# Patient Record
Sex: Male | Born: 1980 | Race: Black or African American | Hispanic: No | Marital: Single | Smoking: Current some day smoker
Health system: Southern US, Community
[De-identification: ages and names within clinical notes are randomized; demographics above are authoritative.]

## PROBLEM LIST (undated history)

## (undated) DIAGNOSIS — M5430 Sciatica, unspecified side: Secondary | ICD-10-CM

---

## 2010-10-13 ENCOUNTER — Emergency Department (HOSPITAL_COMMUNITY): Payer: BC Managed Care – PPO

## 2010-10-13 ENCOUNTER — Emergency Department (HOSPITAL_COMMUNITY)
Admission: EM | Admit: 2010-10-13 | Discharge: 2010-10-14 | Disposition: A | Discharge status: Home / Self Care | Payer: BC Managed Care – PPO | Attending: Emergency Medicine | Admitting: Emergency Medicine

## 2010-10-13 DIAGNOSIS — R112 Nausea with vomiting, unspecified: Secondary | ICD-10-CM | POA: Insufficient documentation

## 2010-10-13 DIAGNOSIS — R197 Diarrhea, unspecified: Secondary | ICD-10-CM | POA: Insufficient documentation

## 2010-10-13 LAB — DIFFERENTIAL
Basophils Absolute: 0 10*3/uL (ref 0.0–0.1)
Eosinophils Absolute: 0 10*3/uL (ref 0.0–0.7)
Eosinophils Relative: 0 % (ref 0–5)
Lymphs Abs: 0.7 10*3/uL (ref 0.7–4.0)
Neutrophils Relative %: 83 % — ABNORMAL HIGH (ref 43–77)

## 2010-10-13 LAB — CBC
Platelets: 185 10*3/uL (ref 150–400)
RBC: 6.96 MIL/uL — ABNORMAL HIGH (ref 4.22–5.81)
RDW: 17.3 % — ABNORMAL HIGH (ref 11.5–15.5)
WBC: 8.4 10*3/uL (ref 4.0–10.5)

## 2010-10-13 IMAGING — CR DG ABDOMEN ACUTE W/ 1V CHEST
4 series · 4 of 4 positions shown · non-contrast
Comparison: None.

CLINICAL DATA: Nausea, vomiting, diarrhea

ACUTE ABDOMEN SERIES (ABDOMEN 2 VIEW & CHEST 1 VIEW)

[w chest pa]
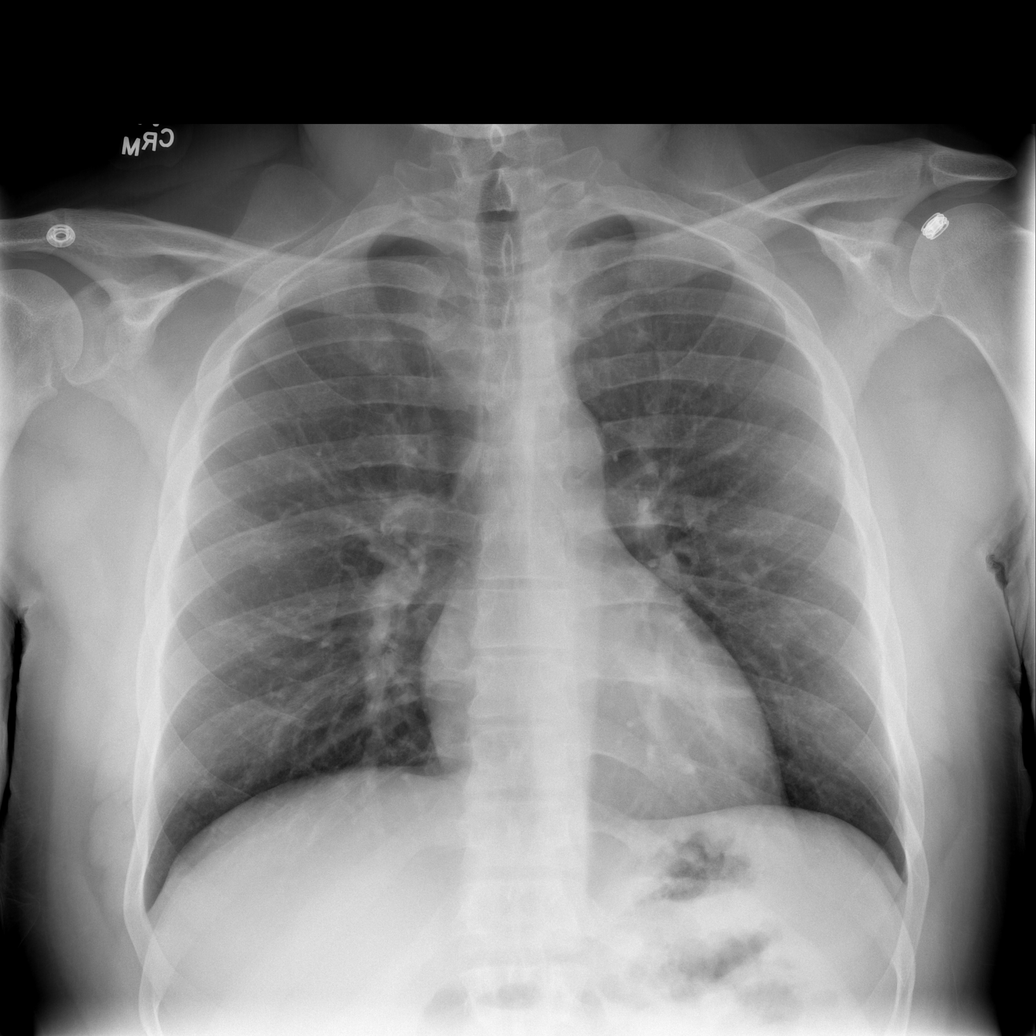

[w abdomen upright]
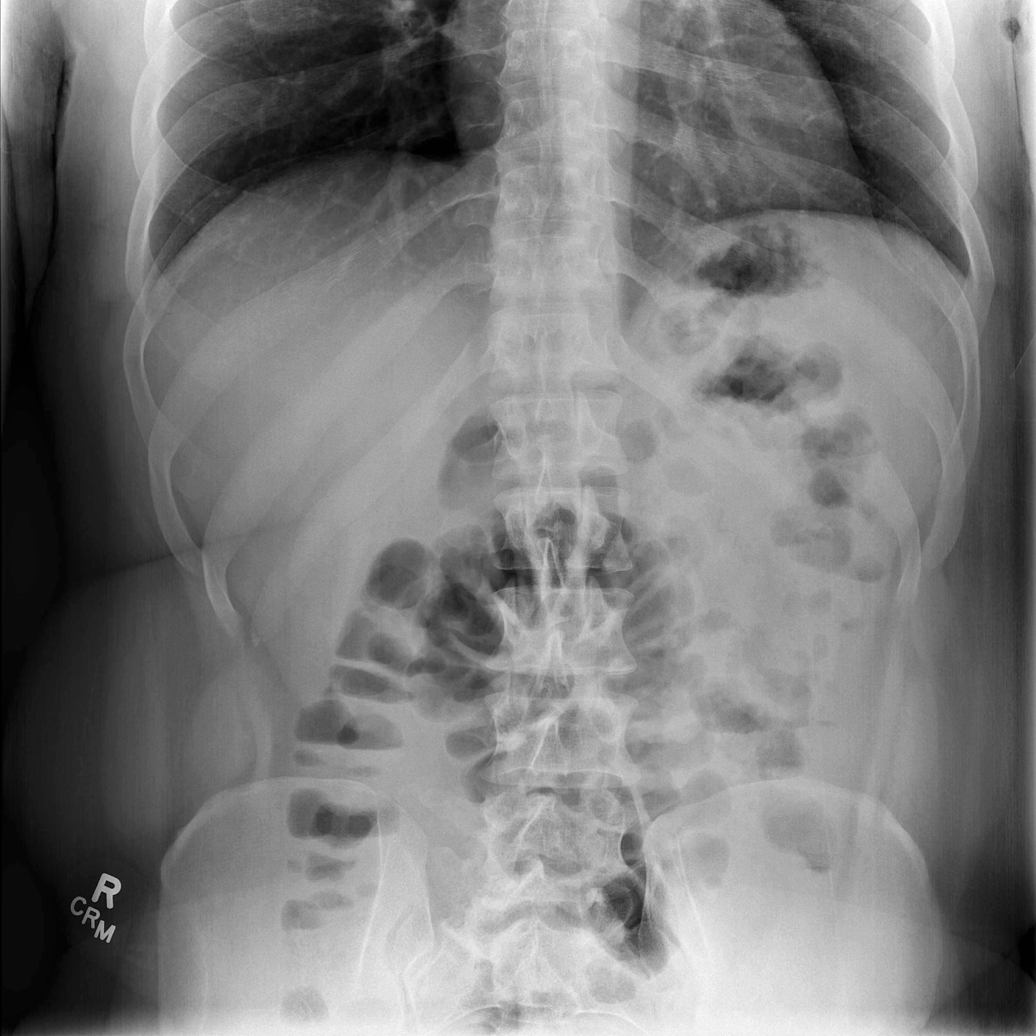

[t abdomen supine (1 of 2)]
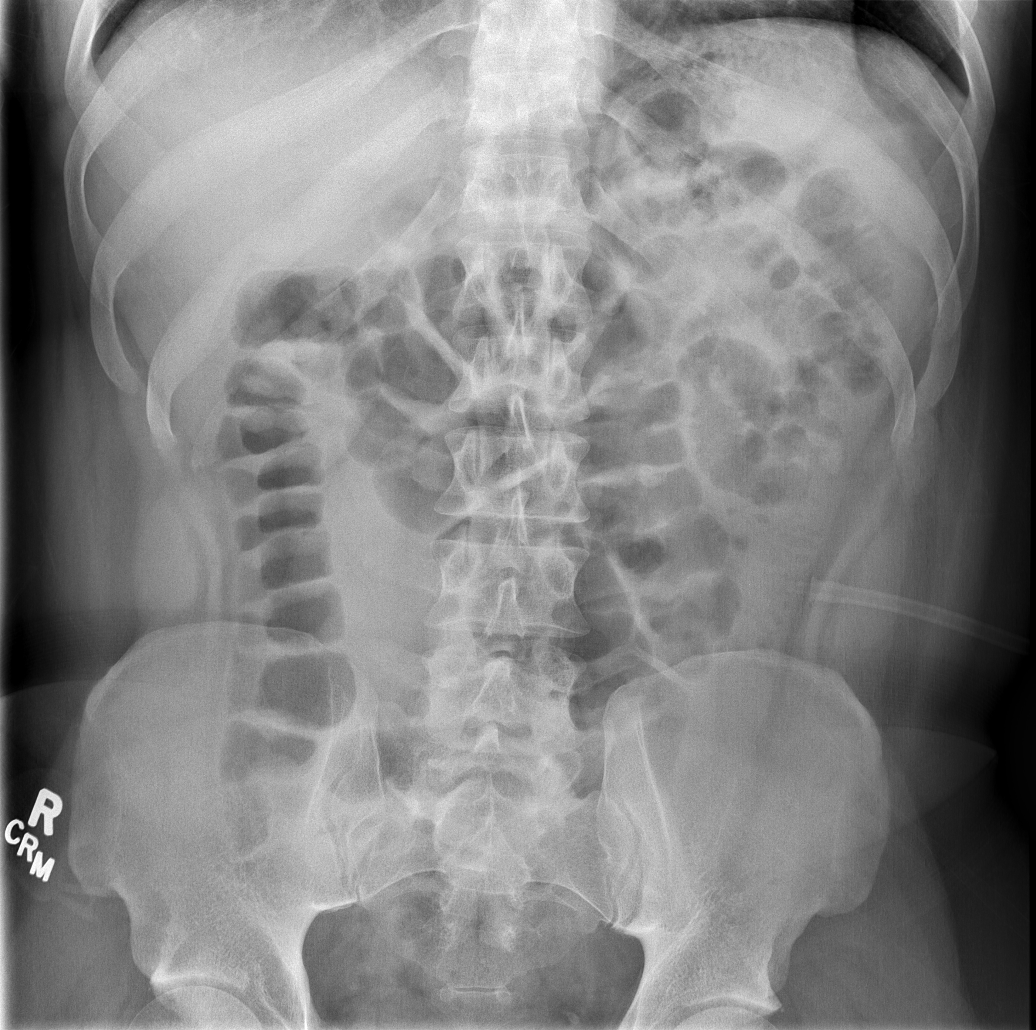

[t abdomen supine (2 of 2)]
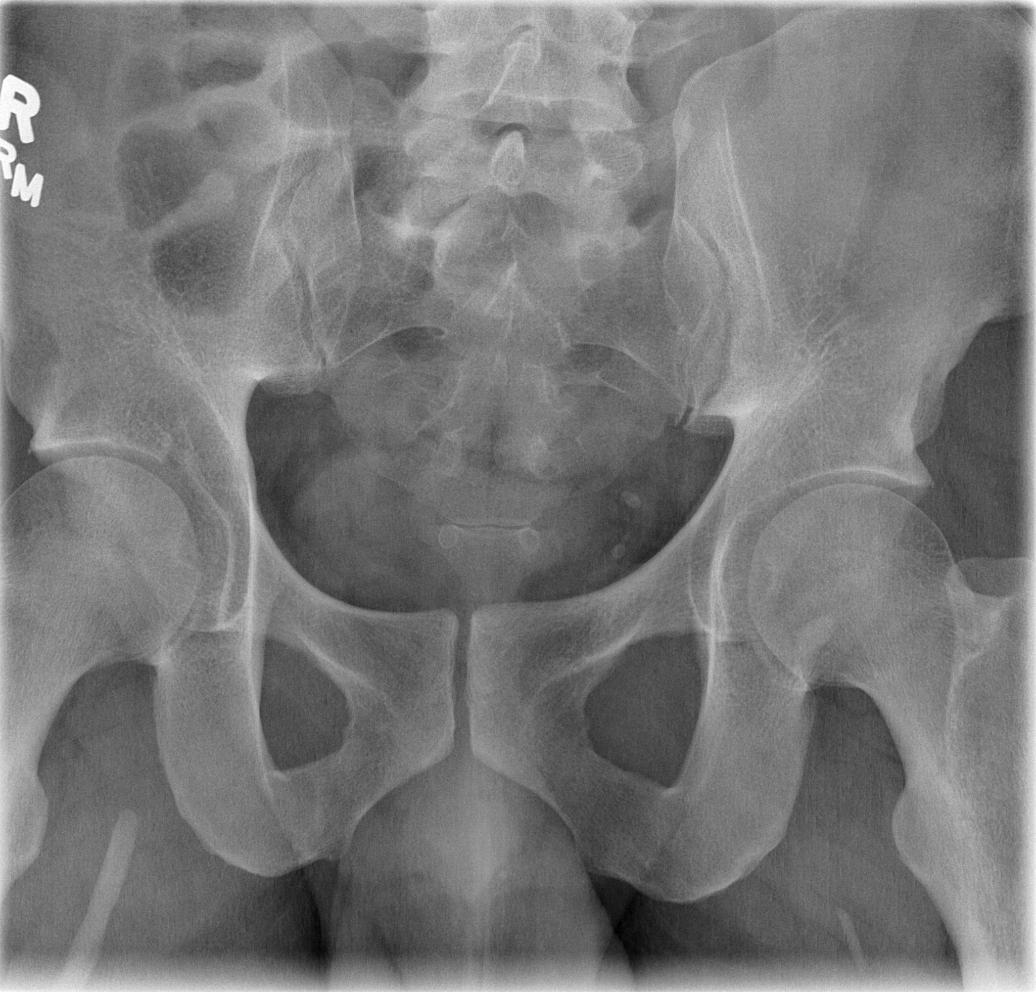

[4 of 4 positions shown; findings below may reference images not displayed]

FINDINGS: The frontal chest radiograph is clear.  No effusion.

No free air on the erect film.

A few gas filled nondilated small bowel loops scattered throughout
the mid abdomen.  Normal distribution of gas and stool throughout
the colon.  No abnormal abdominal calcifications.  Visualized bones
unremarkable.  Left pelvic phleboliths.
IMPRESSION: 1.  Normal bowel gas pattern.
2.  No free air.
3.  No acute cardiopulmonary disease.

## 2010-10-14 LAB — URINALYSIS, ROUTINE W REFLEX MICROSCOPIC
Hgb urine dipstick: NEGATIVE
Ketones, ur: 15 mg/dL — AB
Urine Glucose, Fasting: NEGATIVE mg/dL
pH: 5.5 (ref 5.0–8.0)

## 2010-10-14 LAB — COMPREHENSIVE METABOLIC PANEL
ALT: 39 U/L (ref 0–53)
AST: 40 U/L — ABNORMAL HIGH (ref 0–37)
Albumin: 4 g/dL (ref 3.5–5.2)
Alkaline Phosphatase: 58 U/L (ref 39–117)
BUN: 11 mg/dL (ref 6–23)
Chloride: 104 mEq/L (ref 96–112)
GFR calc Af Amer: >60 mL/min (ref >60)
Potassium: 3.8 mEq/L (ref 3.5–5.1)
Sodium: 140 mEq/L (ref 135–145)
Total Bilirubin: 0.5 mg/dL (ref 0.3–1.2)
Total Protein: 6.6 g/dL (ref 6.0–8.3)

## 2010-10-15 LAB — URINE CULTURE
Colony Count: NO GROWTH
Culture  Setup Time: 201202211228
Culture: NO GROWTH

## 2018-10-11 ENCOUNTER — Other Ambulatory Visit: Payer: Self-pay | Admitting: Internal Medicine

## 2018-10-11 ENCOUNTER — Ambulatory Visit
Admission: RE | Admit: 2018-10-11 | Discharge: 2018-10-11 | Disposition: A | Discharge status: Home / Self Care | Payer: BC Managed Care – PPO | Source: Ambulatory Visit | Attending: Internal Medicine | Admitting: Internal Medicine

## 2018-10-11 DIAGNOSIS — R109 Unspecified abdominal pain: Secondary | ICD-10-CM

## 2018-10-11 IMAGING — CR DG ABDOMEN 2V
3 series · 3 of 3 positions shown · non-contrast
Comparison: [DATE]

CLINICAL DATA: Abdominal pain with diarrhea, nausea and bloating
for 1 day.

EXAM:
ABDOMEN - 2 VIEW

[w abdomen upright *]
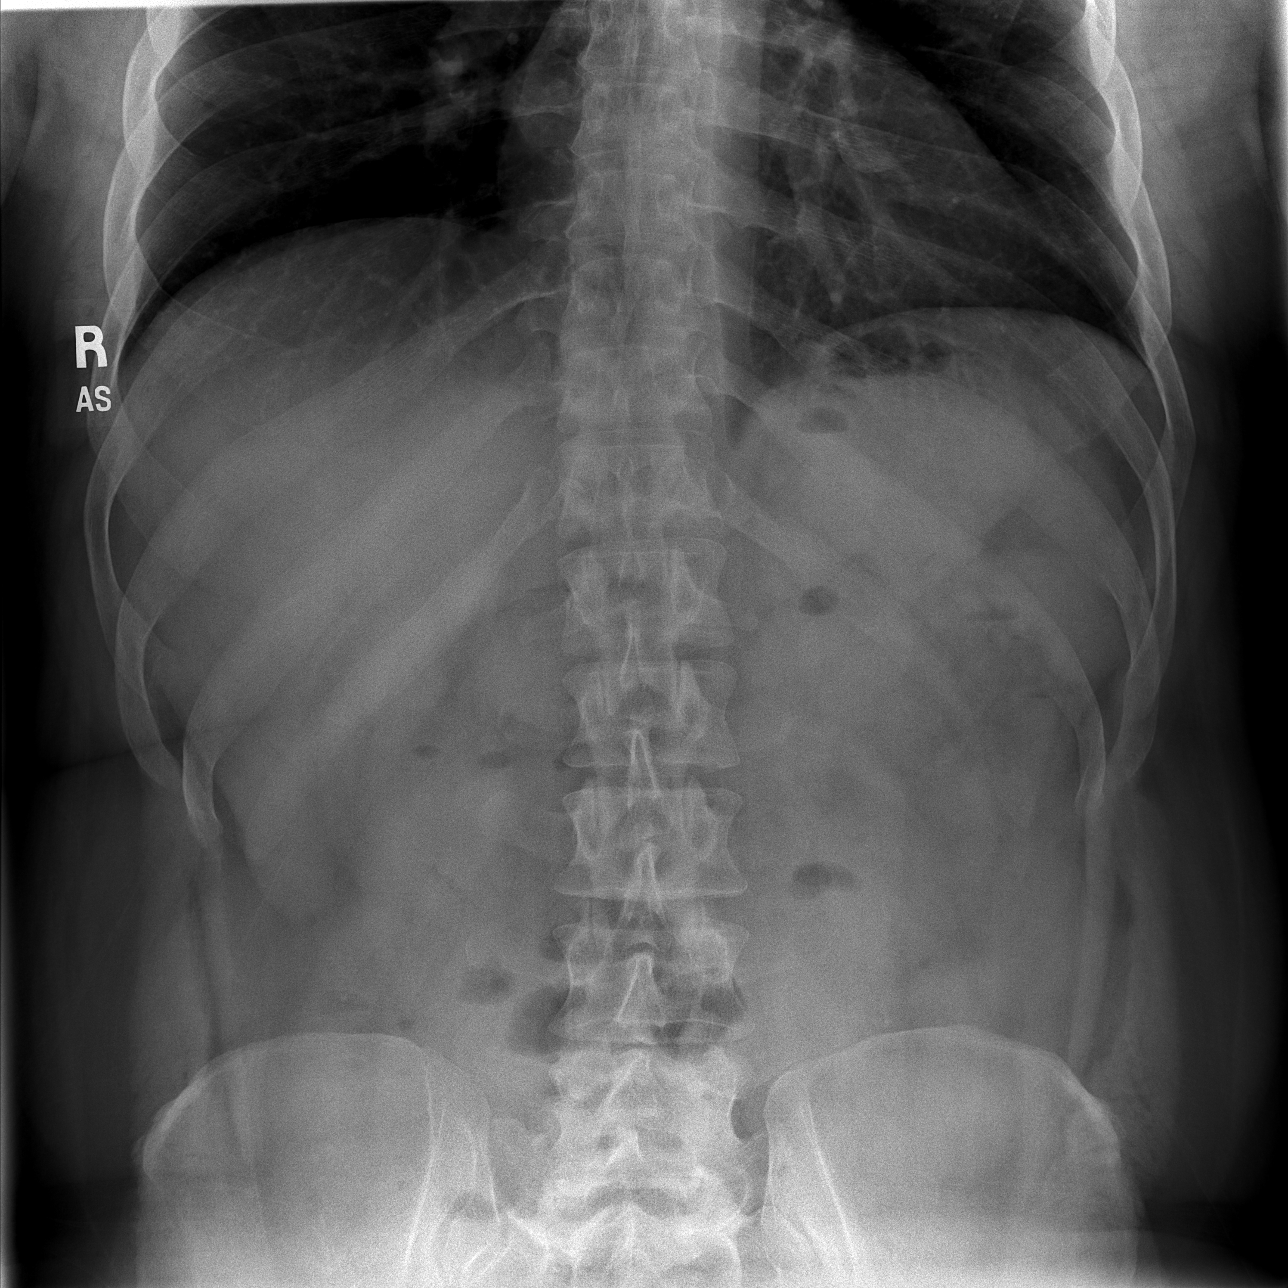

[t abdomen supine (1 of 2)]
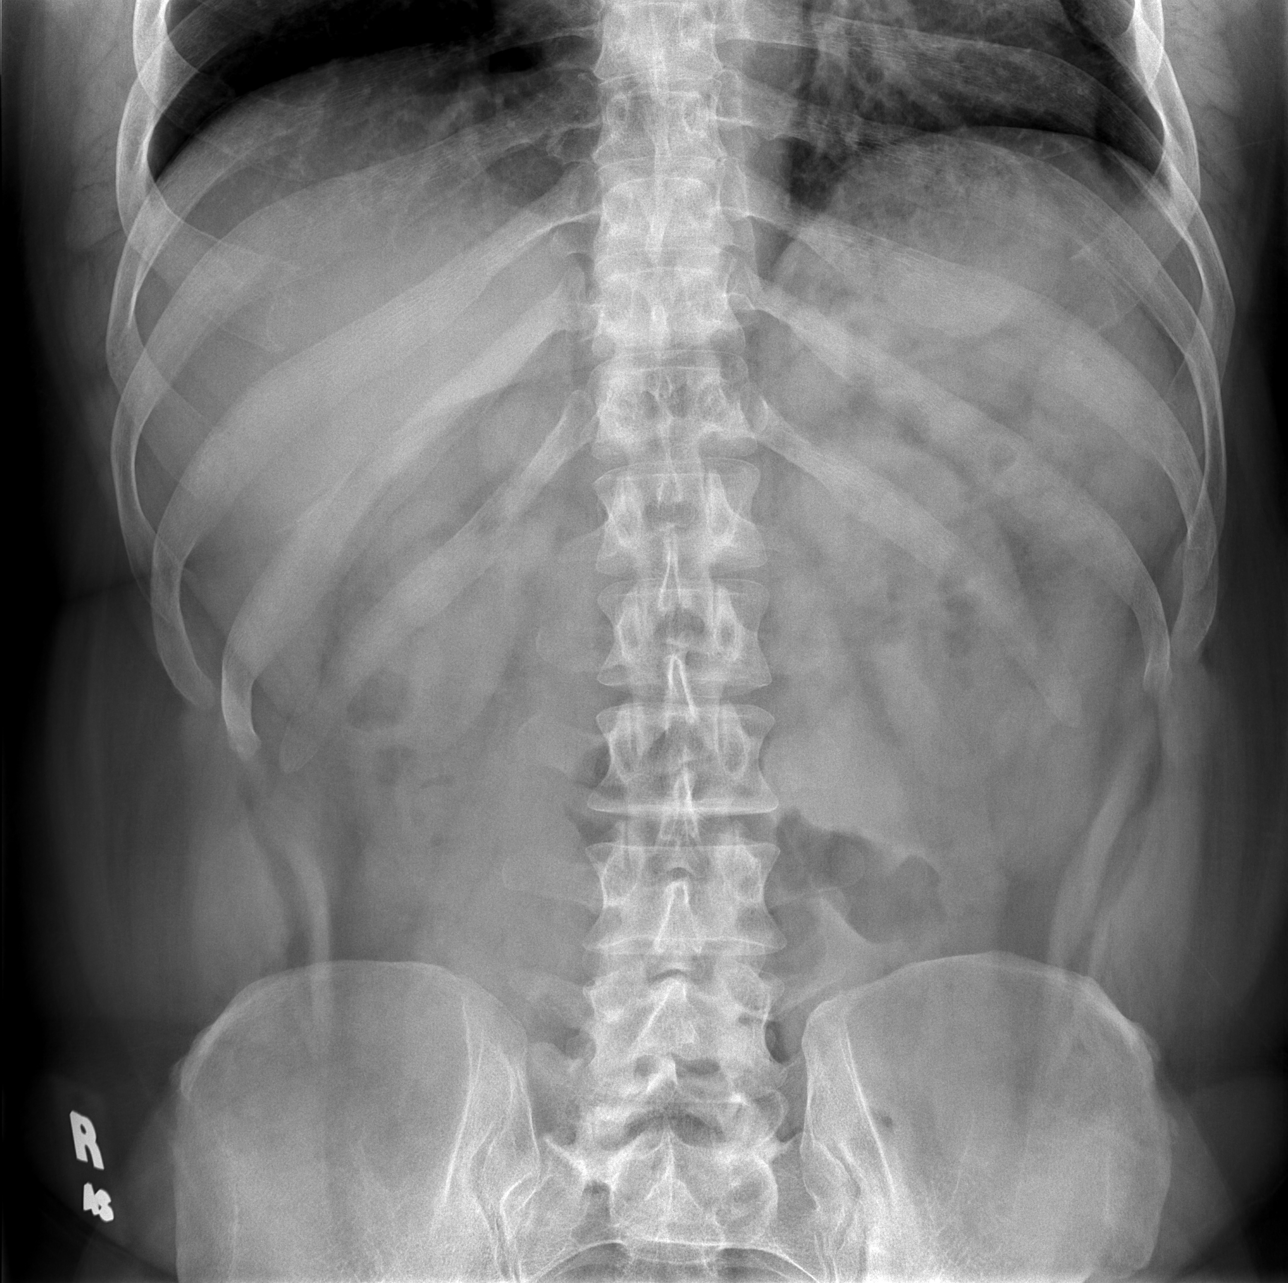

[t abdomen supine (2 of 2)]
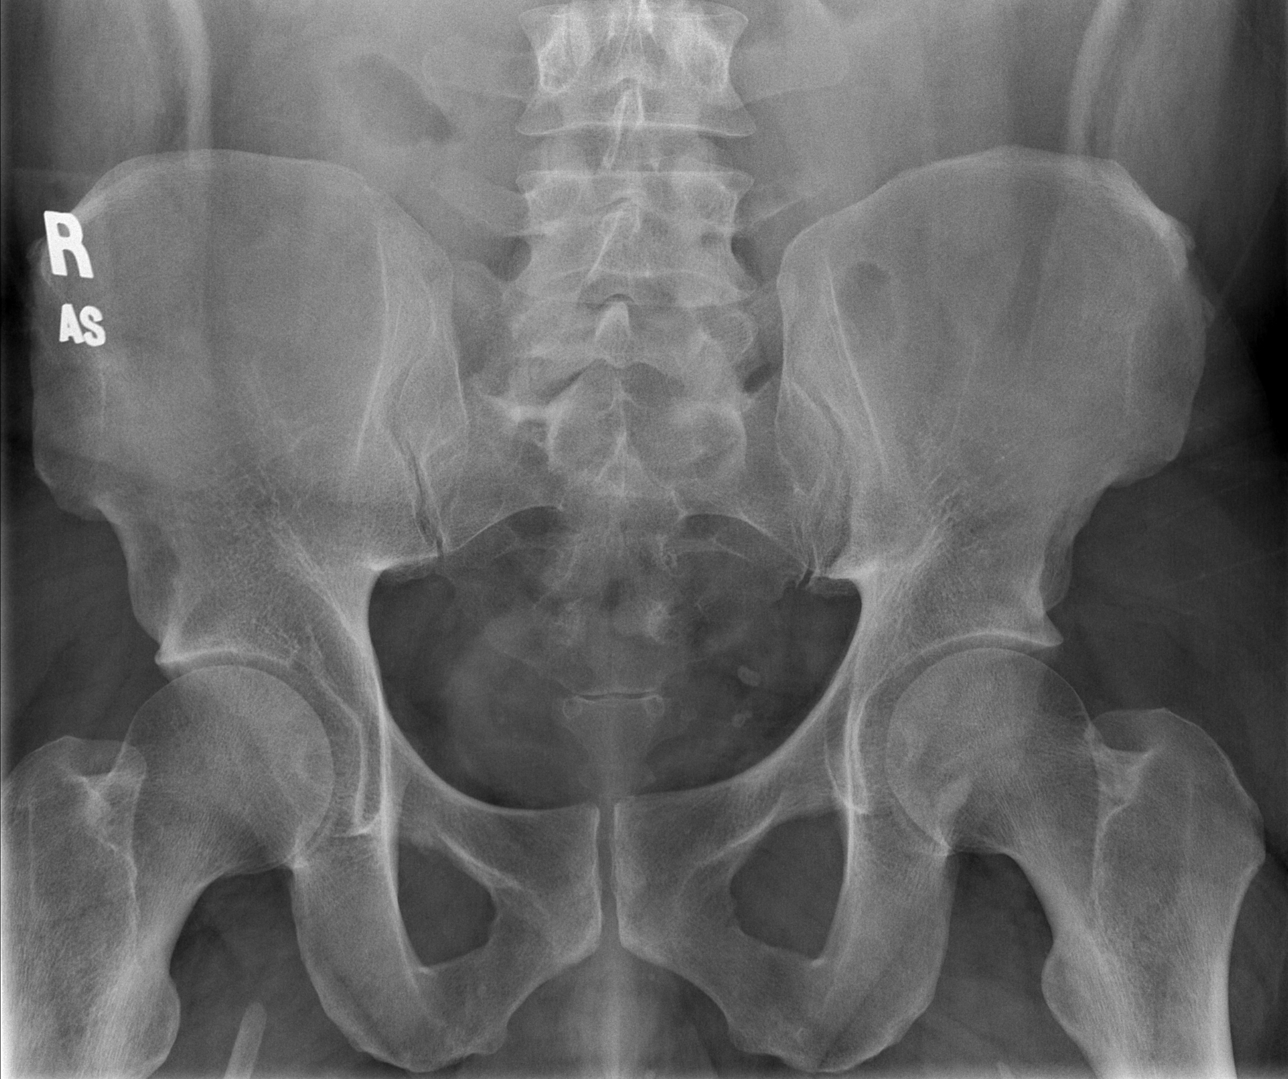

[3 of 3 positions shown; findings below may reference images not displayed]

FINDINGS: There is a relative paucity of bowel gas. No evidence of bowel
obstruction, significant generalized adynamic ileus or of free air.

Abdominal and pelvic soft tissues are within normal limits.

No skeletal abnormality.  Clear lung bases.
IMPRESSION: 1. Relative paucity of bowel gas consistent with the history of
diarrhea.
2. No other abnormality.  No bowel obstruction or free air.

## 2019-06-15 ENCOUNTER — Other Ambulatory Visit: Payer: Self-pay

## 2019-06-15 DIAGNOSIS — Z20822 Contact with and (suspected) exposure to covid-19: Secondary | ICD-10-CM

## 2019-06-17 LAB — NOVEL CORONAVIRUS, NAA: SARS-CoV-2, NAA: NOT DETECTED

## 2019-11-09 ENCOUNTER — Ambulatory Visit: Payer: BC Managed Care – PPO | Attending: Family

## 2019-11-09 DIAGNOSIS — Z23 Encounter for immunization: Secondary | ICD-10-CM

## 2019-11-09 NOTE — Progress Notes (Signed)
   Covid-19 Vaccination Clinic  Name:  Dennis Joseph    MRN: [id number] DOB: 1980-12-11  11/09/2019  Mr. [name] was observed post Covid-19 immunization for 15 minutes without incident. He was provided with Vaccine Information Sheet and instruction to access the V-Safe system.   Mr. [name] was instructed to call 911 with any severe reactions post vaccine: Marland Kitchen Difficulty breathing  . Swelling of face and throat  . A fast heartbeat  . A bad rash all over body  . Dizziness and weakness   Immunizations Administered    Name Date Dose VIS Date Route   Moderna COVID-19 Vaccine 11/09/2019  2:29 PM 0.5 mL 07/25/2019 Intramuscular   Manufacturer: Moderna   Lot: [id number]   NDC: 68616-837-29

## 2019-12-12 ENCOUNTER — Ambulatory Visit: Payer: BC Managed Care – PPO | Attending: Family

## 2019-12-12 DIAGNOSIS — Z23 Encounter for immunization: Secondary | ICD-10-CM

## 2019-12-12 NOTE — Progress Notes (Signed)
   Covid-19 Vaccination Clinic  Name:  Dennis Joseph    MRN: [id number] DOB: 13-Jul-1981  12/12/2019  [name] was observed post Covid-19 immunization for 15 minutes without incident. He was provided with Vaccine Information Sheet and instruction to access the V-Safe system.   [name] was instructed to call 911 with any severe reactions post vaccine: Marland Kitchen Difficulty breathing  . Swelling of face and throat  . A fast heartbeat  . A bad rash all over body  . Dizziness and weakness   Immunizations Administered    Name Date Dose VIS Date Route   Moderna COVID-19 Vaccine 12/12/2019  1:23 PM 0.5 mL 07/2019 Intramuscular   Manufacturer: Moderna   Lot: [id number]   NDC: 25638-937-34

## 2020-01-12 ENCOUNTER — Encounter (HOSPITAL_BASED_OUTPATIENT_CLINIC_OR_DEPARTMENT_OTHER): Payer: Self-pay | Admitting: *Deleted

## 2020-01-12 ENCOUNTER — Emergency Department (HOSPITAL_BASED_OUTPATIENT_CLINIC_OR_DEPARTMENT_OTHER): Payer: BC Managed Care – PPO

## 2020-01-12 ENCOUNTER — Other Ambulatory Visit: Payer: Self-pay

## 2020-01-12 DIAGNOSIS — X58XXXA Exposure to other specified factors, initial encounter: Secondary | ICD-10-CM | POA: Diagnosis not present

## 2020-01-12 DIAGNOSIS — S39012A Strain of muscle, fascia and tendon of lower back, initial encounter: Secondary | ICD-10-CM | POA: Insufficient documentation

## 2020-01-12 DIAGNOSIS — Y9289 Other specified places as the place of occurrence of the external cause: Secondary | ICD-10-CM | POA: Diagnosis not present

## 2020-01-12 DIAGNOSIS — Y9389 Activity, other specified: Secondary | ICD-10-CM | POA: Insufficient documentation

## 2020-01-12 DIAGNOSIS — M5489 Other dorsalgia: Secondary | ICD-10-CM | POA: Diagnosis present

## 2020-01-12 DIAGNOSIS — Y999 Unspecified external cause status: Secondary | ICD-10-CM | POA: Diagnosis not present

## 2020-01-12 IMAGING — DX DG LUMBAR SPINE COMPLETE 4+V
5 series · 5 of 5 positions shown · non-contrast
Comparison: None.

CLINICAL DATA: Back pain

EXAM:
LUMBAR SPINE - COMPLETE 4+ VIEW

[l-spine ap]
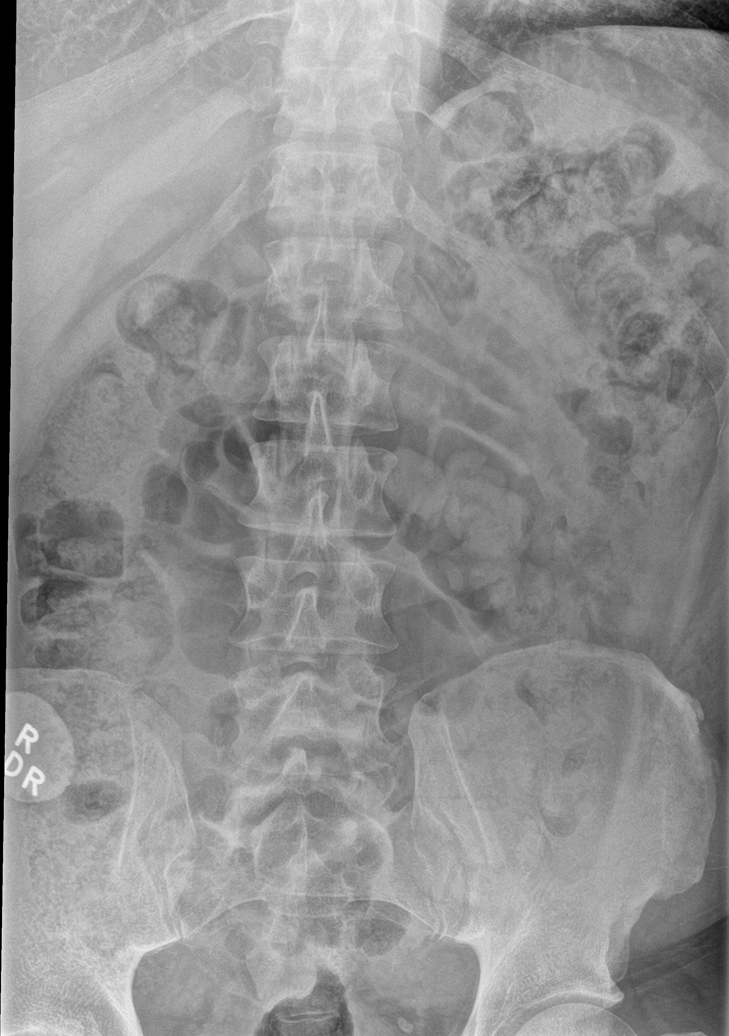

[l-spine obl (1 of 2)]
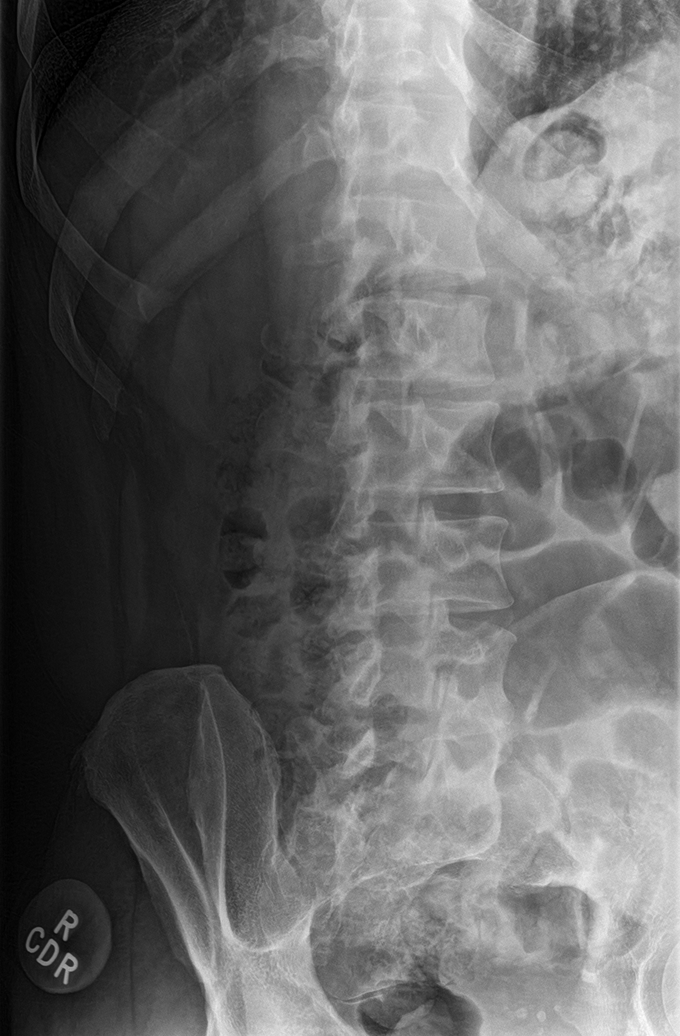

[l-spine obl (2 of 2)]
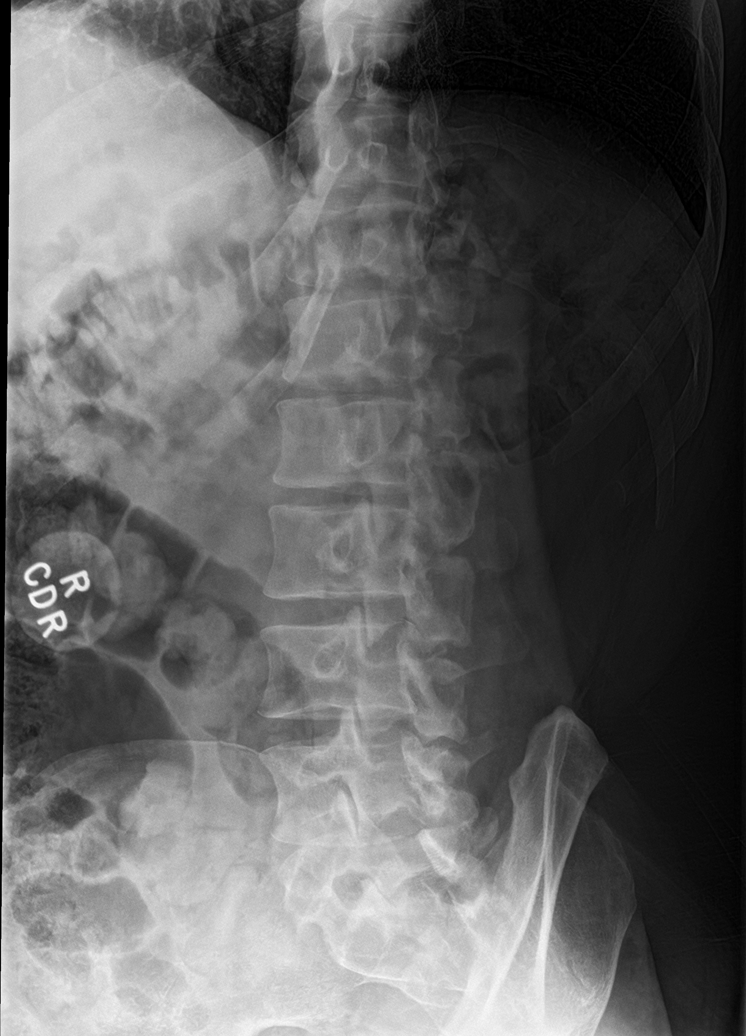

[l-spine lat]
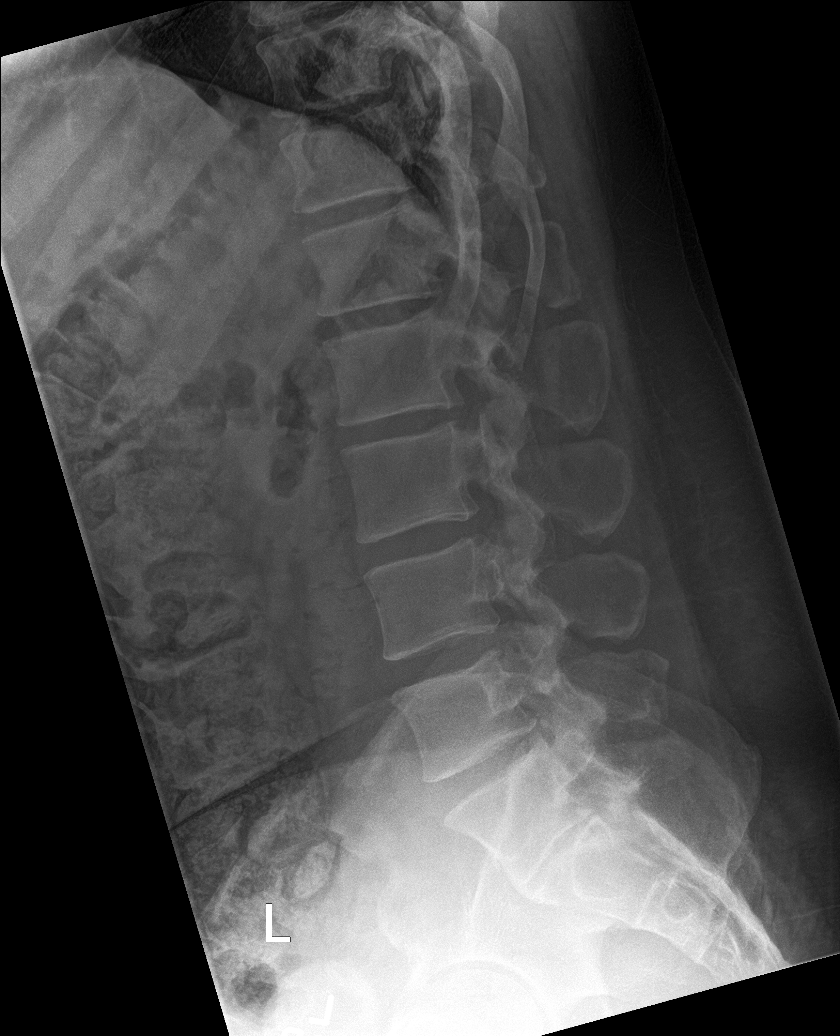

[l-spine spot]
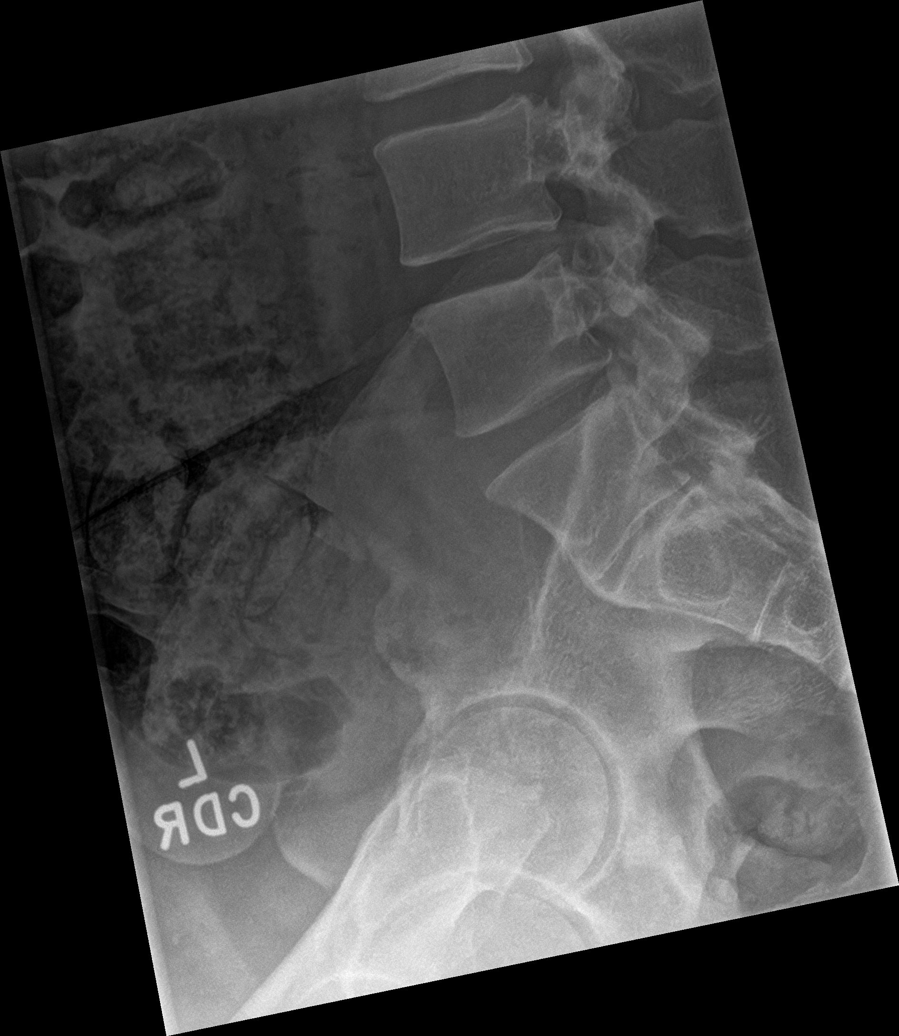

[5 of 5 positions shown; findings below may reference images not displayed]

FINDINGS: There is no evidence of lumbar spine fracture. Alignment is normal.
Intervertebral disc spaces are maintained. There is a large amount
of stool in the colon.
IMPRESSION: Negative.

## 2020-01-12 NOTE — ED Triage Notes (Signed)
C/o  Lower right back pain which radiates into right hip and thigh  X 9 days

## 2020-01-13 ENCOUNTER — Emergency Department (HOSPITAL_BASED_OUTPATIENT_CLINIC_OR_DEPARTMENT_OTHER)
Admission: EM | Admit: 2020-01-13 | Discharge: 2020-01-13 | Disposition: A | Discharge status: Home / Self Care | Payer: BC Managed Care – PPO | Attending: Emergency Medicine | Admitting: Emergency Medicine

## 2020-01-13 DIAGNOSIS — S39012A Strain of muscle, fascia and tendon of lower back, initial encounter: Secondary | ICD-10-CM

## 2020-01-13 HISTORY — DX: Sciatica, unspecified side: M54.30

## 2020-01-13 NOTE — ED Notes (Signed)
ED Provider at bedside. 

## 2020-01-13 NOTE — Discharge Instructions (Signed)
You may use over-the-counter Motrin (Ibuprofen), Acetaminophen (Tylenol), topical muscle creams such as SalonPas, Icy Hot, Bengay, etc. Please stretch, apply heat, and have massage therapy for additional assistance. ° °

## 2020-01-13 NOTE — ED Provider Notes (Signed)
[facility] EMERGENCY DEPARTMENT Provider Note  CSN: [id number] Arrival date & time: 01/12/20 2318  Chief Complaint(s) Back Pain  HPI Dennis Joseph is a 39 y.o. male   HPI  CC: Lower back pain  Onset/Duration: 1 to 2 weeks Timing: Constant but fluctuating. Location: Right lower back Quality: Aching/cramping Severity: Mild to severe. Currently mild Modifying Factors:  Improved by: Ice, heat, massages, ibuprofen  Worsened by: Movement Associated Signs/Symptoms:  Pertinent (+): Pain radiating down to the mid thigh  Pertinent (-): Fevers, chills, abdominal pain, bladder/bowel incontinence, lower extremity weakness or loss of sensation Context: No trauma.  Believes he overdid it with rest.  Past Medical History Past Medical History:  Diagnosis Date  . Sciatica    There are no problems to display for this patient.  Home Medication(s) Prior to Admission medications   Not on File                                                                                                                                    Past Surgical History History reviewed. No pertinent surgical history. Family History No family history on file.  Social History Social History   Tobacco Use  . Smoking status: Never Smoker  . Smokeless tobacco: Never Used  Substance Use Topics  . Alcohol use: Not Currently  . Drug use: Not Currently   Allergies Patient has no known allergies.  Review of Systems Review of Systems All other systems are reviewed and are negative for acute change except as noted in the HPI  Physical Exam Vital Signs  I have reviewed the triage vital signs BP 124/68 (BP Location: Right Arm)   Pulse (!) 50   Temp 98.6 F (37 C)   Resp 20   SpO2 97%   Physical Exam Vitals reviewed.  Constitutional:      General: He is not in acute distress.    Appearance: He is well-developed. He is not diaphoretic.  HENT:     Head: Normocephalic and atraumatic.     Jaw:  No trismus.     Right Ear: External ear normal.     Left Ear: External ear normal.     Nose: Nose normal.  Eyes:     General: No scleral icterus.    Conjunctiva/sclera: Conjunctivae normal.  Neck:     Trachea: Phonation normal.  Cardiovascular:     Rate and Rhythm: Normal rate and regular rhythm.  Pulmonary:     Effort: Pulmonary effort is normal. No respiratory distress.     Breath sounds: No stridor.  Abdominal:     General: There is no distension.  Musculoskeletal:        General: Normal range of motion.     Cervical back: Normal range of motion.  Neurological:     Mental Status: He is alert and oriented to person, place, and time.     Comments: Spine Exam: Strength: 5/5  throughout LE bilaterally (hip flexion/extension, adduction/abduction; knee flexion/extension; foot dorsiflexion/plantarflexion, inversion/eversion; great toe inversion) Sensation: Intact to light touch in proximal and distal LE bilaterally Reflexes: 1+ quadriceps and achilles reflexes   Psychiatric:        Behavior: Behavior normal.     ED Results and Treatments Labs (all labs ordered are listed, but only abnormal results are displayed) Labs Reviewed - No data to display                                                                                                                       EKG  EKG Interpretation  Date/Time:    Ventricular Rate:    PR Interval:    QRS Duration:   QT Interval:    QTC Calculation:   R Axis:     Text Interpretation:        Radiology DG Lumbar Spine Complete  Result Date: 01/13/2020 CLINICAL DATA:  Back pain EXAM: LUMBAR SPINE - COMPLETE 4+ VIEW COMPARISON:  None. FINDINGS: There is no evidence of lumbar spine fracture. Alignment is normal. Intervertebral disc spaces are maintained. There is a large amount of stool in the colon. IMPRESSION: Negative. Electronically Signed   By: Katherine Mantle M.D.   On: 01/13/2020 00:54    Pertinent labs & imaging results that  were available during my care of the patient were reviewed by me and considered in my medical decision making (see chart for details).  Medications Ordered in ED Medications - No data to display                                                                                                                                  Procedures Procedures  (including critical care time)  Medical Decision Making / ED Course I have reviewed the nursing notes for this encounter and the patient's prior records (if available in EHR or on provided paperwork).   Dennis Joseph was evaluated in Emergency Department on 01/13/2020 for the symptoms described in the history of present illness. He was evaluated in the context of the global COVID-19 pandemic, which necessitated consideration that the patient might be at risk for infection with the SARS-CoV-2 virus that causes COVID-19. Institutional protocols and algorithms that pertain to the evaluation of patients at risk for COVID-19 are in a state of rapid change based on information released by regulatory bodies including the CDC and federal and state organizations.  These policies and algorithms were followed during the patient's care in the ED.  39 y.o. male presents with back pain in lumbar area for 2 weeks with signs of radicular pain. No acute traumatic onset. No red flag symptoms of fever, weight loss, saddle anesthesia, weakness, fecal/urinary incontinence or urinary retention.   Plain film in triage negative.  Suspect MSK etiology. No indication for imaging emergently. Patient was recommended to take short course of scheduled NSAIDs and engage in early mobility as definitive treatment. Return precautions discussed for worsening or new concerning symptoms.        Final Clinical Impression(s) / ED Diagnoses Final diagnoses:  Strain of lumbar region, initial encounter    The patient appears reasonably screened and/or stabilized for discharge and I doubt  any other medical condition or other [facility] requiring further screening, evaluation, or treatment in the ED at this time prior to discharge. Safe for discharge with strict return precautions.  Disposition: Discharge  Condition: Good  I have discussed the results, Dx and Tx plan with the patient/family who expressed understanding and agree(s) with the plan. Discharge instructions discussed at length. The patient/family was given strict return precautions who verbalized understanding of the instructions. No further questions at time of discharge.    ED Discharge Orders    None        Follow Up: Seward Carol, MD [address] [place] [address] [phone]  Schedule an appointment as soon as possible for a visit       This chart was dictated using voice recognition software.  Despite best efforts to proofread,  errors can occur which can change the documentation meaning.   Fatima Blank, MD 01/13/20 [phone]

## 2020-01-15 ENCOUNTER — Other Ambulatory Visit: Payer: Self-pay | Admitting: Geriatric Medicine

## 2020-01-15 DIAGNOSIS — M545 Low back pain, unspecified: Secondary | ICD-10-CM

## 2020-01-17 ENCOUNTER — Ambulatory Visit
Admission: RE | Admit: 2020-01-17 | Discharge: 2020-01-17 | Disposition: A | Discharge status: Home / Self Care | Payer: BC Managed Care – PPO | Source: Ambulatory Visit | Attending: Geriatric Medicine | Admitting: Geriatric Medicine

## 2020-01-17 DIAGNOSIS — M545 Low back pain, unspecified: Secondary | ICD-10-CM

## 2020-01-17 IMAGING — MR MR LUMBAR SPINE W/O CM
4 of 6 series · 18 of 48 positions shown · non-contrast
Comparison: Comparison made with prior radiograph from [DATE].

CLINICAL DATA: Initial evaluation for back pain in spasms,
difficulty with ambulation for 1 month.

EXAM:
MRI LUMBAR SPINE WITHOUT CONTRAST
TECHNIQUE: Multiplanar, multisequence MR imaging of the lumbar spine was
performed. No intravenous contrast was administered.

[Series 5: T2 · sagittal · 4.0mm · 0.73mm/px · 4 of 15 slices shown (1 of 2)]
[im 1/15]
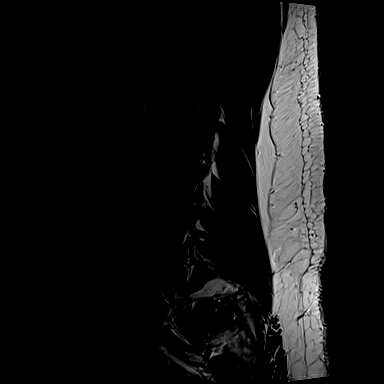
[im 5/15]
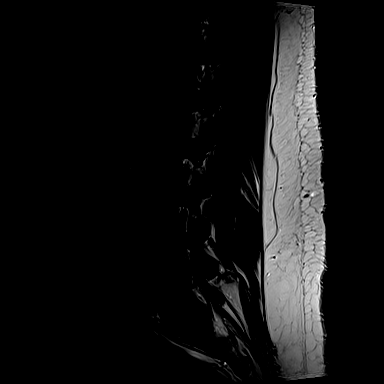
[im 10/15]
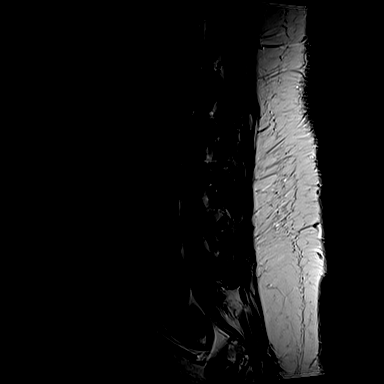
[im 15/15]
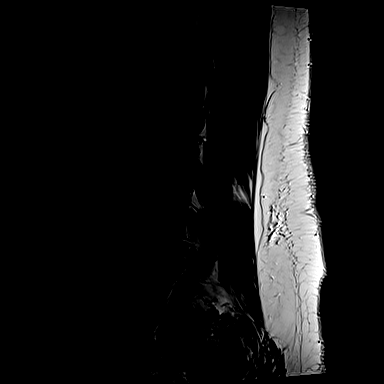

[Series 6: T1 · sagittal · 4.0mm · 0.73mm/px · 3 of 15 slices shown (1 of 2)]
[im 1/15]
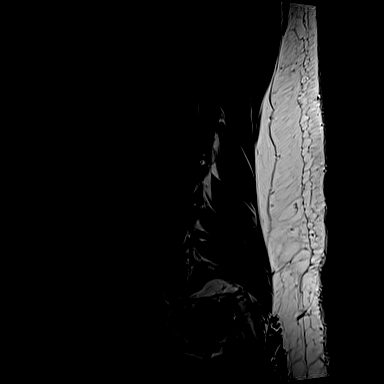
[im 10/15]
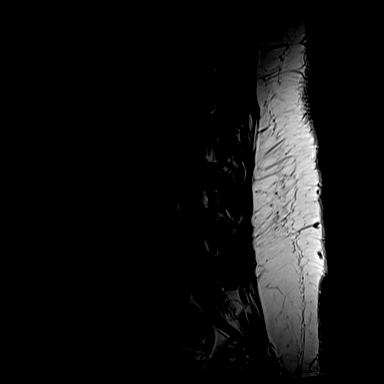
[im 15/15]
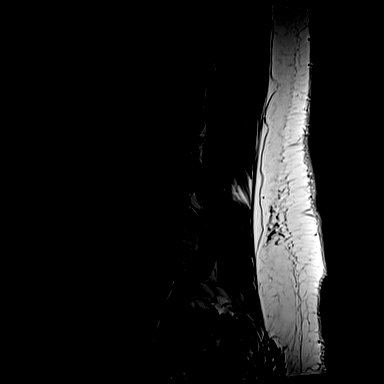

[Series 10: T1 · axial · 4.0mm · 0.28mm/px · z∈[-75,+78]mm · 3 of 39 slices shown (2 of 2)]
[im 7/39]
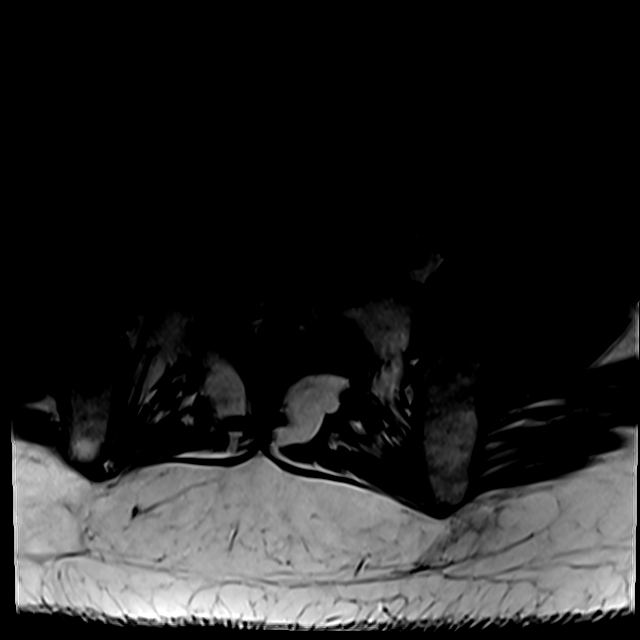
[im 21/39]
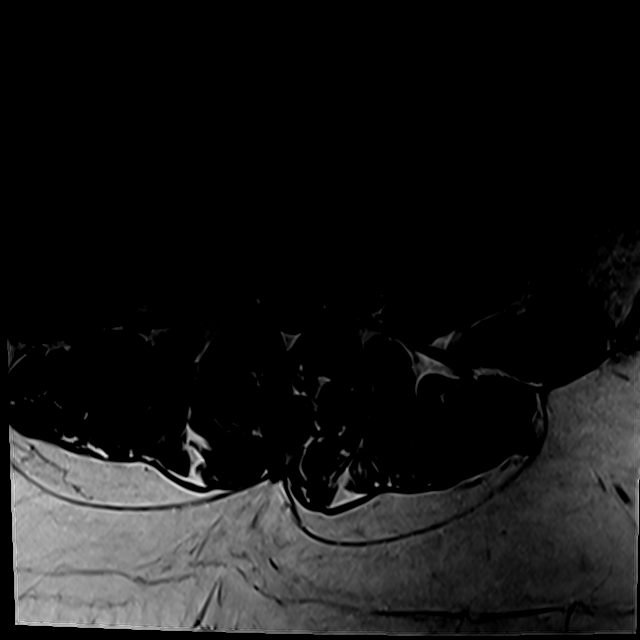
[im 35/39]
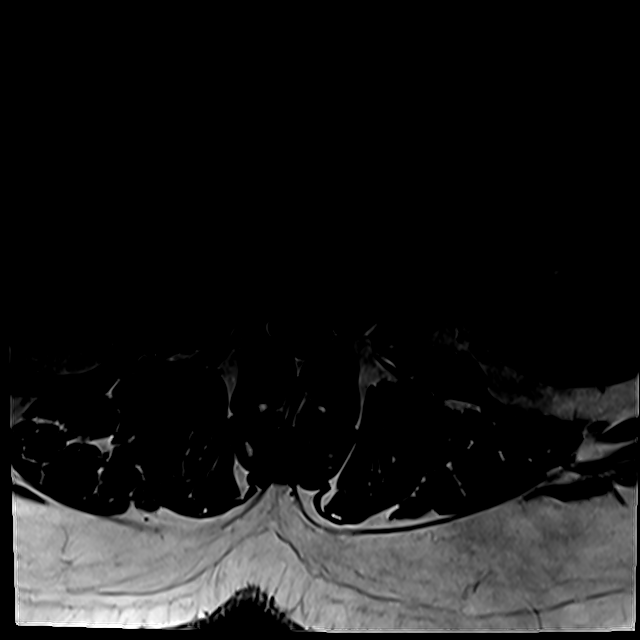

[Series 17: T2 · axial · 4.0mm · 0.28mm/px · z∈[-96,+77]mm · 8 of 39 slices shown (2 of 2)]
[im 1/39]
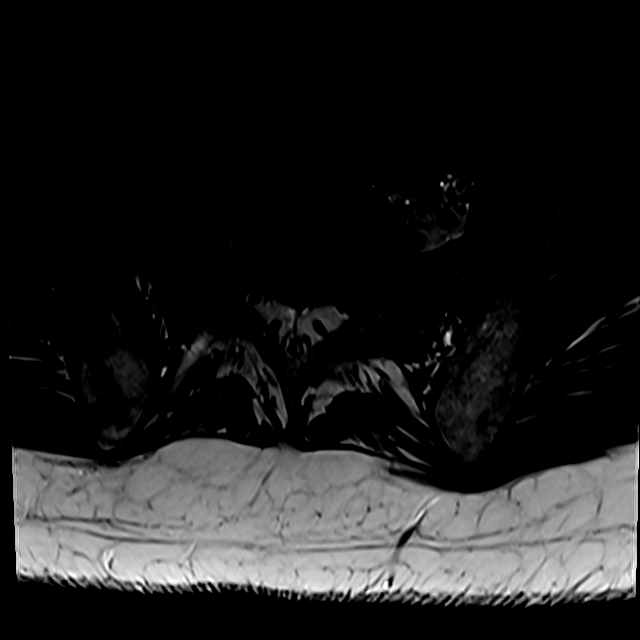
[im 7/39]
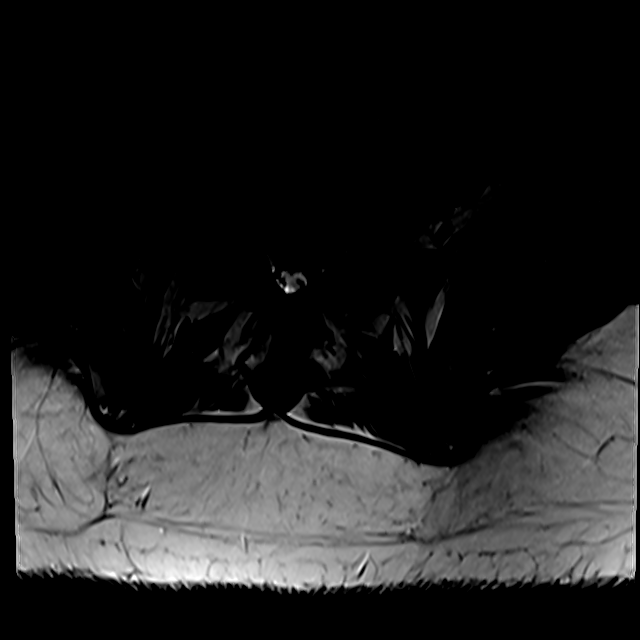
[im 11/39]
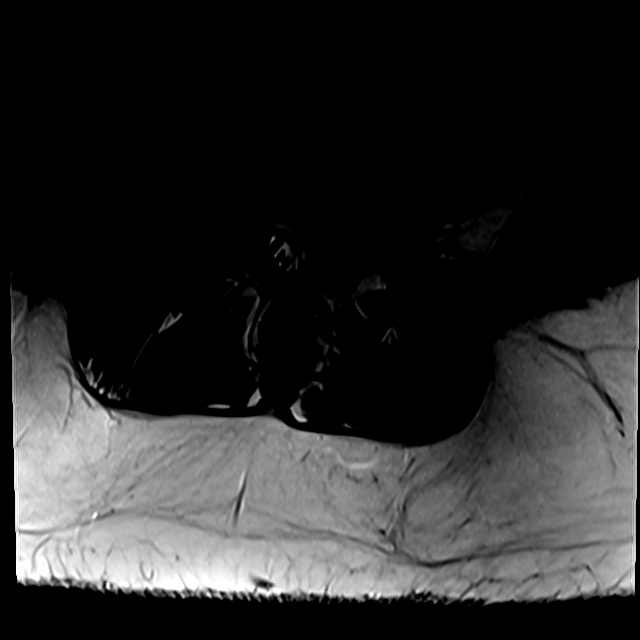
[im 18/39]
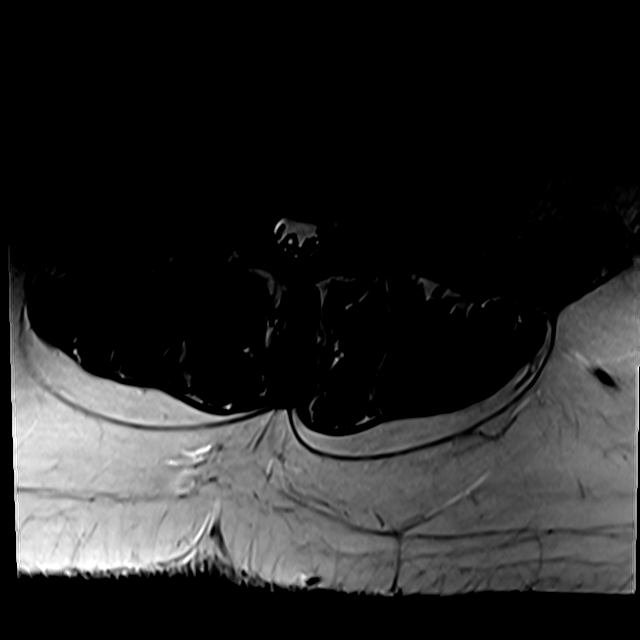
[im 21/39]
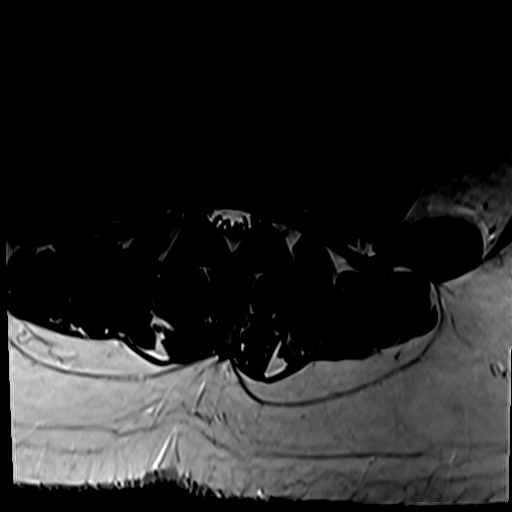
[im 28/39]
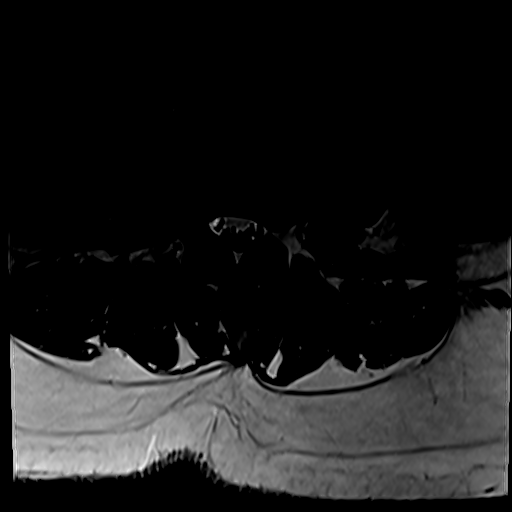
[im 32/39]
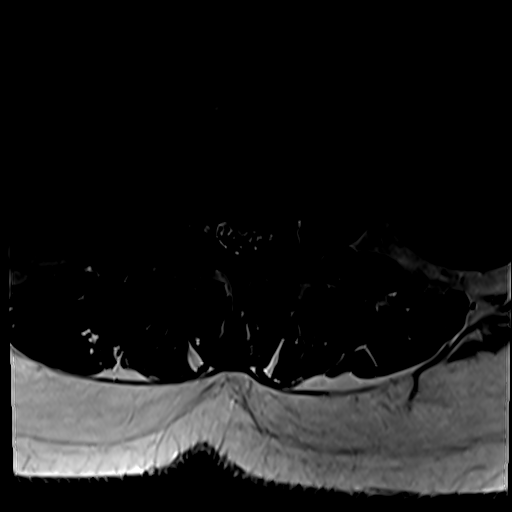
[im 35/39]
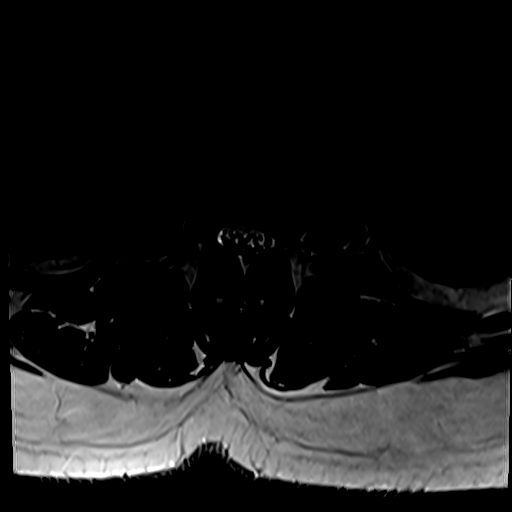

[18 of 48 positions shown; findings below may reference images not displayed]

FINDINGS: Segmentation: Transitional lumbosacral anatomy with a partially
sacralized S1 segment. Small vestigial S1-2 interspace noted.

Alignment: Physiologic with preservation of the normal lumbar
lordosis. No listhesis.

Vertebrae: Vertebral body height maintained without evidence for
acute or chronic fracture. Bone marrow signal intensity within
normal limits. No discrete or worrisome osseous lesions. No abnormal
marrow edema.

Conus medullaris and cauda equina: Conus extends to the L1 level.
Conus and cauda equina appear normal.

Paraspinal and other soft tissues: Paraspinous soft tissues within
normal limits. Small simple parapelvic cyst noted within the left
kidney. Circumaortic left renal vein noted. Visualized visceral
structures otherwise unremarkable.

Disc levels:

L1-2:  Unremarkable.

L2-3:  Unremarkable.

L3-4: Normal interspace. Minimal facet hypertrophy. No canal or
foraminal stenosis.

L4-5: Normal interspace. Mild facet hypertrophy. No canal or
foraminal stenosis.

L5-S1: Diffuse disc bulge with disc desiccation and intervertebral
disc space narrowing. Superimposed moderate to large sized central
disc extrusion indents and largely effaces the ventral thecal sac
(series 17, image 31). Superimposed mild to moderate facet
hypertrophy. Resultant severe canal, with severe right worse than
left lateral recess stenosis. Mild bilateral L5 foraminal narrowing.

S1-2: Small vestigial S1-2 interspace. No disc bulge or disc
protrusion. No stenosis.
IMPRESSION: 1. Moderate to large sized central disc extrusion at L5-S1 with
resultant severe canal and bilateral lateral recess stenosis.
2. Mild bilateral L5 foraminal stenosis related to disc bulge and
facet hypertrophy.
3. Transitional lumbosacral anatomy with a partially sacralized S1
segment. Careful correlation with numbering system on this exam
recommended prior to any potential future intervention.

## 2020-02-07 HISTORY — PX: OTHER SURGICAL HISTORY: SHX169

## 2020-03-14 ENCOUNTER — Encounter: Payer: Self-pay | Admitting: Physical Therapy

## 2020-03-14 ENCOUNTER — Other Ambulatory Visit: Payer: Self-pay

## 2020-03-14 ENCOUNTER — Ambulatory Visit: Payer: BC Managed Care – PPO | Attending: Neurological Surgery | Admitting: Physical Therapy

## 2020-03-14 DIAGNOSIS — M62830 Muscle spasm of back: Secondary | ICD-10-CM | POA: Insufficient documentation

## 2020-03-14 DIAGNOSIS — M62838 Other muscle spasm: Secondary | ICD-10-CM | POA: Insufficient documentation

## 2020-03-14 DIAGNOSIS — M6281 Muscle weakness (generalized): Secondary | ICD-10-CM | POA: Diagnosis present

## 2020-03-14 NOTE — Therapy (Addendum)
[facility] [address], [address] [place], [place], [address] Phone: [phone]   Fax:  [phone]  Physical Therapy Evaluation  Patient Details  Name: Dennis Joseph MRN: [id number] Date of Birth: April 28, 1981 Referring Provider (PT): Jadene Pierini, MD   Encounter Date: 03/14/2020   PT End of Session - 03/14/20 1745    Visit Number 1    Date for PT Re-Evaluation 06/06/20    PT Start Time 1533    PT Stop Time 1617    PT Time Calculation (min) 44 min    Activity Tolerance Patient tolerated treatment well    Behavior During Therapy [facility] for tasks assessed/performed           Past Medical History:  Diagnosis Date  . Sciatica     Past Surgical History:  Procedure Laterality Date  . lumbar discectomy L5 Bilateral 02/07/2020    There were no vitals filed for this visit.    Subjective Assessment - 03/14/20 1539    Subjective Pt states he is having tension in back and throughout his core.  States he is not lifting and not bending much but at 6 weeks he is able to take those.  Has had the tension before the surgery also since April, now nerve and scaitic pain is gone but the muscle spasms are still there.    Pertinent History 02/07/20 lumbar discectomy L5    Limitations Lifting    Patient Stated Goals be able to easily bend and pick things up    Currently in Pain? Yes    Pain Score 7     Pain Location Back    Pain Orientation Right;Left    Pain Descriptors / Indicators Tightness    Pain Type Chronic pain    Pain Radiating Towards left side mid/lower back; Rt side lower    Pain Onset More than a month ago    Pain Frequency Intermittent    Aggravating Factors  long periods of walking and standing; bending and picking up things    Pain Relieving Factors position changes    Multiple Pain Sites No              [facility] PT Assessment - 03/17/20 0001      Assessment   Medical Diagnosis M54.16 (ICD-10-CM) -  Radiculopathy, lumbar region    Referring Provider (PT) Jadene Pierini, MD    Onset Date/Surgical Date 02/07/20    Prior Therapy No - get chiropractic and massage typically but not right now      Precautions   Precautions Back    Precaution Comments for one more week post surgery      Balance Screen   Has the patient fallen in the past 6 months No      Home Environment   Living Environment Private residence    Living Arrangements Spouse/significant other      Prior Function   Level of Independence Independent    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      Observation/Other Assessments   Focus on Therapeutic Outcomes (FOTO)  will do at follow up visit      Posture/Postural Control   Posture/Postural Control Postural limitations    Postural Limitations Anterior pelvic tilt      ROM / Strength   AROM / PROM / Strength Strength;PROM      PROM   Overall PROM Comments hip ER 20% limited  Strength   Overall Strength Comments bil hip abduction 4/5; bil hip adduction 4+/5      Flexibility   Soft Tissue Assessment /Muscle Length yes    Hamstrings <40 deg bil      Palpation   Palpation comment gluteals, hamstring, rectus abdominals and obliques tight; DRA one finger superior to umbilicus noted tenting with small sit up during MMT      Special Tests   Other special tests SLS 10 sec; Trendelenburg Lt with greater instability than Rt side; visually noted some decreased muslce mass Lt gluteals      Ambulation/Gait   Gait Pattern Within Functional Limits                      Objective measurements completed on examination: See above findings.       [facility] Adult PT Treatment/Exercise - 03/17/20 0001      Self-Care   Self-Care Other Self-Care Comments    Other Self-Care Comments  intial HEP and DN info given                  PT Education - 03/14/20 1748    Education Details Access  Code: [email or web address]    Person(s) Educated Patient    Methods Explanation;Demonstration;Verbal cues    Comprehension Verbalized understanding;Returned demonstration            PT Short Term Goals - 03/17/20 1945      PT SHORT TERM GOAL #1   Title ind with intitial HEP    Time 4    Period Weeks    Status New    Target Date 04/11/20      PT SHORT TERM GOAL #2   Title FOTO score assessed    Time 4    Period Weeks    Status New    Target Date 04/11/20             PT Long Term Goals - 03/17/20 1947      PT LONG TERM GOAL #1   Title able to bend and pick up something off the floor with 75% more ease    Time 12    Period Weeks    Status New    Target Date 06/06/20      PT LONG TERM GOAL #2   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 at least 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    Baseline 7/10    Time 12    Period Weeks    Status New    Target Date 06/06/20      PT LONG TERM GOAL #3   Title Pt will be able to do 30 min walk with jogging intervals and knows how to progress as part of exercise routine    Time 12    Period Weeks    Status New    Target Date 06/06/20      PT LONG TERM GOAL #4   Title Ind with advanced HEP to maintain core strength and pelvic stability during exercise    Time 12    Period Weeks    Status New    Target Date 06/06/20      PT LONG TERM GOAL #5   Title pt will be able to perform SLS x30 sec bilat without Trendelenburg or instability for endurance needed during running activities    Time 12    Period Weeks    Status New    Target Date 06/06/20  Plan - 03/17/20 1944    Clinical Impression Statement Pt presents to skilled PT s/p L5 discectomy.  He has not been having pain but has muscle spasms that has been chronic even prior to disc issue.  He wants to return to activities of a healthy lifestyle for weight management including running.  Pt has muscle tension in bilat h/s with length less than 40 deg in supine bil  and blat gluteals and recutus abdominus and obliques also very tight.  Pt has tenting and diastasis rectus abdominus with abdominal MMT.  Pt has Trendelenburg and hip instability Lt LE with 10 sec SLS.  Pt has hip weakness and mentioned above.  He will benefit from techniques to reduce muscle spasms and improve deep core strength and other muscle impairments for improved function and correct menachics during his activities of healthy lifestyle.    Personal Factors and Comorbidities Comorbidity 1;Time since onset of injury/illness/exacerbation    Comorbidities L5 discectomy    Examination-Activity Limitations Lift;Bend;Other;Stand    Stability/Clinical Decision Making Stable/Uncomplicated    PT Frequency 1x / week    PT Duration 12 weeks    PT Treatment/Interventions ADLs/Self Care Home Management;Biofeedback;Cryotherapy;Electrical Stimulation;Moist Heat;Traction;Gait training;Therapeutic activities;Therapeutic exercise;Neuromuscular re-education;Manual techniques;Patient/family education;Passive range of motion;Dry needling;Taping    PT Next Visit Plan possibly DN gluteals and h/s; stretches; begin TrA core strength and hip strengthening    PT Home Exercise Plan Access Code: [email or web address] and dry needling info    Consulted and Agree with Plan of Care Patient           Patient will benefit from skilled therapeutic intervention in order to improve the following deficits and impairments:  Pain, Increased muscle spasms, Impaired flexibility, Decreased strength  Visit Diagnosis: Muscle spasm of back  Other muscle spasm  Muscle weakness (generalized)     Problem List There are no problems to display for this patient.   Brayton Caves Matty Vanroekel, PT 03/17/2020, 8:00 PM  [facility] [address], [address] [place], [place], [address] Phone: [phone]   Fax:  [phone]  Name: Dennis Joseph MRN: [id number] Date of Birth: 04-07-1981

## 2020-03-14 NOTE — Patient Instructions (Signed)
Access Code: [email or web address] URL: [email or web address] Date: 03/14/2020 Prepared by: Dwana Curd  Exercises Clam - 1 x daily - 7 x weekly - 3 sets - 10 reps Supine Figure 4 Piriformis Stretch with Leg Extension - 1 x daily - 7 x weekly - 3 sets - 10 reps Standing Hamstring Stretch with Step - 1 x daily - 7 x weekly - 3 reps - 1 sets - 30 sec hold Seated Hamstring Stretch - 1 x daily - 7 x weekly - 3 reps - 1 sets - 30 sec hold  Patient Education Trigger Point Dry Needling

## 2020-03-17 NOTE — Addendum Note (Signed)
Addended by: Beatris Si on: 03/17/2020 08:03 PM   Modules accepted: Orders

## 2020-03-27 ENCOUNTER — Other Ambulatory Visit: Payer: Self-pay

## 2020-03-27 ENCOUNTER — Encounter: Payer: Self-pay | Admitting: Physical Therapy

## 2020-03-27 ENCOUNTER — Ambulatory Visit: Payer: BC Managed Care – PPO | Attending: Neurological Surgery | Admitting: Physical Therapy

## 2020-03-27 DIAGNOSIS — M6281 Muscle weakness (generalized): Secondary | ICD-10-CM | POA: Insufficient documentation

## 2020-03-27 DIAGNOSIS — M62830 Muscle spasm of back: Secondary | ICD-10-CM | POA: Diagnosis not present

## 2020-03-27 DIAGNOSIS — M62838 Other muscle spasm: Secondary | ICD-10-CM | POA: Insufficient documentation

## 2020-03-27 NOTE — Therapy (Signed)
[facility] [address] [place], [place], [address] Phone: [phone]   Fax:  [phone]  Physical Therapy Treatment  Patient Details  Name: Dennis Joseph MRN: [id number] Date of Birth: [birth year] Referring Provider (PT): Jadene Pierini, MD   Encounter Date: 03/27/2020   PT End of Session - 03/27/20 1543    Visit Number 2    Date for PT Re-Evaluation 06/06/20    Authorization Type bcbs    PT Start Time 1530    PT Stop Time 1615    PT Time Calculation (min) 45 min    Activity Tolerance Patient tolerated treatment well    Behavior During Therapy [facility] for tasks assessed/performed           Past Medical History:  Diagnosis Date  . Sciatica     Past Surgical History:  Procedure Laterality Date  . lumbar discectomy L5 Bilateral 02/07/2020    There were no vitals filed for this visit.   Subjective Assessment - 03/27/20 1535    Subjective I have some pain in my back muscles that are drawing up. 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    Pertinent History 02/07/20 lumbar discectomy L5    Limitations Lifting    Patient Stated Goals be able to easily bend and pick things up    Currently in Pain? Yes    Pain Score 6     Pain Location Back    Pain Orientation Right;Left    Pain Descriptors / Indicators Tightness    Pain Type Chronic pain    Pain Onset More than a month ago    Pain Frequency Intermittent    Aggravating Factors  long periods of walking and standing, bending and picking up things    Pain Relieving Factors position changes    Multiple Pain Sites No                             [facility] Adult PT Treatment/Exercise - 03/27/20 0001      Lumbar Exercises: Aerobic   Stationary Bike 8 min level 1 while assessing patient      Lumbar Exercises: Supine   Ab Set 10 reps;5 seconds    AB Set Limitations with verbal cues to not hold breath and bring rib cage downward    Other Supine Lumbar Exercises  shoulder press with abdominal bracing holding for 3 seconds      Manual Therapy   Manual Therapy Soft tissue mobilization    Manual therapy comments to assess for dry needling    Soft tissue mobilization lumbar paraspinals, left gluteals and hamstring            Trigger Point Dry Needling - 03/27/20 0001    Consent Given? Yes    Education Handout Provided Yes    Muscles Treated Lower Quadrant Hamstring   left   Muscles Treated Back/Hip Gluteus maximus   left   Hamstring Response Twitch response elicited;Palpable increased muscle length    Gluteus Maximus Response Twitch response elicited;Palpable increased muscle length                  PT Short Term Goals - 03/17/20 1945      PT SHORT TERM GOAL #1   Title ind with intitial HEP    Time 4    Period Weeks    Status New    Target Date 04/11/20      PT SHORT  TERM GOAL #2   Title FOTO score assessed    Time 4    Period Weeks    Status New    Target Date 04/11/20             PT Long Term Goals - 03/17/20 1947      PT LONG TERM GOAL #1   Title able to bend and pick up something off the floor with 75% more ease    Time 12    Period Weeks    Status New    Target Date 06/06/20      PT LONG TERM GOAL #2   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 at least 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    Baseline 7/10    Time 12    Period Weeks    Status New    Target Date 06/06/20      PT LONG TERM GOAL #3   Title Pt will be able to do 30 min walk with jogging intervals and knows how to progress as part of exercise routine    Time 12    Period Weeks    Status New    Target Date 06/06/20      PT LONG TERM GOAL #4   Title Ind with advanced HEP to maintain core strength and pelvic stability during exercise    Time 12    Period Weeks    Status New    Target Date 06/06/20      PT LONG TERM GOAL #5   Title pt will be able to perform SLS x30 sec bilat without Trendelenburg or instability for endurance needed  during running activities    Time 12    Period Weeks    Status New    Target Date 06/06/20                 Plan - 03/27/20 1604    Clinical Impression Statement Patient has been back at work since Monday and is having increased spasms of the back. Patient tolerated the dry needling well and reduction of pain afterwards. Patient needs verbal cues for correct abdominal contraction and did not dome. Patient will benefit from skilled therapy to improve strength and reduce impairments.    Personal Factors and Comorbidities Comorbidity 1;Time since onset of injury/illness/exacerbation    Comorbidities L5 discectomy    Examination-Activity Limitations Lift;Bend;Other;Stand    Stability/Clinical Decision Making Stable/Uncomplicated    Rehab Potential Good    PT Frequency 1x / week    PT Duration 12 weeks    PT Treatment/Interventions ADLs/Self Care Home Management;Biofeedback;Cryotherapy;Electrical Stimulation;Moist Heat;Traction;Gait training;Therapeutic activities;Therapeutic exercise;Neuromuscular re-education;Manual techniques;Patient/family education;Passive range of motion;Dry needling;Taping    PT Next Visit Plan Assess dry needling; work the core in supine with abdominal bracing and hip movements, squats with hip hinge and abdominal bracing, supine with arm movements    PT Home Exercise Plan Access Code: [email or web address] and dry needling info    Recommended Other Services MD signed intial eval    Consulted and Agree with Plan of Care Patient           Patient will benefit from skilled therapeutic intervention in order to improve the following deficits and impairments:  Pain, Increased muscle spasms, Impaired flexibility, Decreased strength  Visit Diagnosis: Muscle spasm of back  Other muscle spasm  Muscle weakness (generalized)     Problem List There are no problems to display for this patient.   Dennis Joseph, PT 03/27/20 4:27 PM  [facility] [address] [place], [place], [address] Phone: [phone]   Fax:  [phone]  Name: Dennis Joseph MRN: [id number] Date of Birth: 1981/05/19

## 2020-04-10 ENCOUNTER — Other Ambulatory Visit: Payer: Self-pay

## 2020-04-10 ENCOUNTER — Encounter: Payer: Self-pay | Admitting: Physical Therapy

## 2020-04-10 ENCOUNTER — Ambulatory Visit: Payer: BC Managed Care – PPO | Admitting: Physical Therapy

## 2020-04-10 DIAGNOSIS — M6281 Muscle weakness (generalized): Secondary | ICD-10-CM

## 2020-04-10 DIAGNOSIS — M62830 Muscle spasm of back: Secondary | ICD-10-CM

## 2020-04-10 DIAGNOSIS — M62838 Other muscle spasm: Secondary | ICD-10-CM

## 2020-04-10 NOTE — Patient Instructions (Signed)
Access Code: [email or web address] URL: [email or web address] Date: 04/10/2020 Prepared by: [name]  Program Notes mamastafit  instagram   Exercises Clam - 1 x daily - 7 x weekly - 3 sets - 10 reps Supine Figure 4 Piriformis Stretch with Leg Extension - 1 x daily - 7 x weekly - 3 sets - 10 reps Seated Hamstring Stretch - 1 x daily - 7 x weekly - 3 reps - 1 sets - 30 sec hold Seated Pelvic Floor Contraction - 3 x daily - 7 x weekly - 1 sets - 10 reps Supine March - 1 x daily - 7 x weekly - 1 sets - 20 reps Supine Alternating Shoulder Flexion - 1 x daily - 7 x weekly - 3 sets - 10 reps Standard Lunge - 1 x daily - 7 x weekly - 1 sets - 10 reps Prone Terminal Knee Extension - 1 x daily - 7 x weekly - 1 sets - 10 reps - 3 sec hold  Patient Education Trigger [facility] Needling [facility] [address] [place], [place] [address] Phone # [phone] Fax [phone]

## 2020-04-10 NOTE — Therapy (Signed)
[facility] [address] [place], [place], [address] Phone: [phone]   Fax:  [phone]  Physical Therapy Treatment  Patient Details  Name: Dennis Joseph MRN: [id number] Date of Birth: June 22, 1981 Referring Provider (PT): Jadene Pierini, MD   Encounter Date: 04/10/2020   PT End of Session - 04/10/20 1704    Visit Number 3    Date for PT Re-Evaluation 06/06/20    Authorization Type bcbs    PT Start Time 1620    PT Stop Time 1658    PT Time Calculation (min) 38 min    Activity Tolerance Patient tolerated treatment well    Behavior During Therapy [facility] for tasks assessed/performed           Past Medical History:  Diagnosis Date  . Sciatica     Past Surgical History:  Procedure Laterality Date  . lumbar discectomy L5 Bilateral 02/07/2020    There were no vitals filed for this visit.   Subjective Assessment - 04/10/20 1627    Subjective The dry needling and exercises have helped me. I am having tightness but no pain.    Pertinent History 02/07/20 lumbar discectomy L5    Limitations Lifting    Patient Stated Goals be able to easily bend and pick things up    Currently in Pain? No/denies    Multiple Pain Sites No                             [facility] Adult PT Treatment/Exercise - 04/10/20 0001      Lumbar Exercises: Aerobic   Stationary Bike 6 min level 1 while assessing patient      Lumbar Exercises: Supine   Bridge 10 reps    Bridge Limitations keeping feet close to buttocks    Other Supine Lumbar Exercises supine alternate shoulder flexion with green band and abdominal bracing      Lumbar Exercises: Quadruped   Other Quadruped Lumbar Exercises hip hinging keeping spinal neutral      Manual Therapy   Manual Therapy Soft tissue mobilization    Soft tissue mobilization lumbar paraspinals and scar massage                  PT Education - 04/10/20 1704    Education  Details Access Code: [email or web address]    Person(s) Educated Patient    Methods Explanation;Demonstration;Verbal cues;Handout    Comprehension Returned demonstration;Verbalized understanding            PT Short Term Goals - 04/10/20 1707      PT SHORT TERM GOAL #1   Title ind with intitial HEP    Time 4    Period Weeks    Status Achieved    Target Date 04/11/20             PT Long Term Goals - 03/17/20 1947      PT LONG TERM GOAL #1   Title able to bend and pick up something off the floor with 75% more ease    Time 12    Period Weeks    Status New    Target Date 06/06/20      PT LONG TERM GOAL #2   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 at least 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    Baseline 7/10    Time 12    Period Weeks    Status New  Target Date 06/06/20      PT LONG TERM GOAL #3   Title Pt will be able to do 30 min walk with jogging intervals and knows how to progress as part of exercise routine    Time 12    Period Weeks    Status New    Target Date 06/06/20      PT LONG TERM GOAL #4   Title Ind with advanced HEP to maintain core strength and pelvic stability during exercise    Time 12    Period Weeks    Status New    Target Date 06/06/20      PT LONG TERM GOAL #5   Title pt will be able to perform SLS x30 sec bilat without Trendelenburg or instability for endurance needed during running activities    Time 12    Period Weeks    Status New    Target Date 06/06/20                 Plan - 04/10/20 1702    Clinical Impression Statement Patient is not having pain just tightness. He has improved mobility of the lumbar paraspinals. Patient needed tactile cues to keep spinal neutral with hip hinging. Patient is able to engage his core better and is able to advance his supine exercises. 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 Patient is progressing nicely in therapy. Patient will benefit from skilled therapy to work on core stabilization.     Personal Factors and Comorbidities Comorbidity 1;Time since onset of injury/illness/exacerbation    Comorbidities L5 discectomy    Examination-Activity Limitations Lift;Bend;Other;Stand    Stability/Clinical Decision Making Stable/Uncomplicated    Rehab Potential Good    PT Frequency 1x / week    PT Duration 12 weeks    PT Treatment/Interventions ADLs/Self Care Home Management;Biofeedback;Cryotherapy;Electrical Stimulation;Moist Heat;Traction;Gait training;Therapeutic activities;Therapeutic exercise;Neuromuscular re-education;Manual techniques;Patient/family education;Passive range of motion;Dry needling;Taping    PT Next Visit Plan wall plank; dead bug, split stance moving arms    PT Home Exercise Plan Access Code: [email or web address] and dry needling info           Patient will benefit from skilled therapeutic intervention in order to improve the following deficits and impairments:  Pain, Increased muscle spasms, Impaired flexibility, Decreased strength  Visit Diagnosis: Muscle spasm of back  Other muscle spasm  Muscle weakness (generalized)     Problem List There are no problems to display for this patient.   [name], PT 04/10/20 5:09 PM   [facility] [address] [place], [place], [address] Phone: [phone]   Fax:  [phone]  Name: Dennis Joseph MRN: [id number] Date of Birth: 01-Oct-1980

## 2020-04-17 ENCOUNTER — Other Ambulatory Visit: Payer: Self-pay

## 2020-04-17 ENCOUNTER — Encounter: Payer: Self-pay | Admitting: Physical Therapy

## 2020-04-17 ENCOUNTER — Ambulatory Visit: Payer: BC Managed Care – PPO | Admitting: Physical Therapy

## 2020-04-17 DIAGNOSIS — M62838 Other muscle spasm: Secondary | ICD-10-CM

## 2020-04-17 DIAGNOSIS — M62830 Muscle spasm of back: Secondary | ICD-10-CM

## 2020-04-17 DIAGNOSIS — M6281 Muscle weakness (generalized): Secondary | ICD-10-CM

## 2020-04-17 NOTE — Therapy (Signed)
[facility] [address] [place], [place], [address] Phone: [phone]   Fax:  [phone]  Physical Therapy Treatment  Patient Details  Name: Dennis Joseph MRN: [id number] Date of Birth: Oct 07, 1980 Referring Provider (PT): Jadene Pierini, MD   Encounter Date: 04/17/2020   PT End of Session - 04/17/20 1609    Visit Number 4    Date for PT Re-Evaluation 06/06/20    Authorization Type bcbs    PT Start Time 1530    PT Stop Time 1608    PT Time Calculation (min) 38 min    Activity Tolerance Patient tolerated treatment well;No increased pain    Behavior During Therapy [facility] for tasks assessed/performed           Past Medical History:  Diagnosis Date  . Sciatica     Past Surgical History:  Procedure Laterality Date  . lumbar discectomy L5 Bilateral 02/07/2020    There were no vitals filed for this visit.   Subjective Assessment - 04/17/20 1533    Subjective I felt good after last visit. I am feeling stronger. I am not feeling the normal back tension.    Pertinent History 02/07/20 lumbar discectomy L5    Limitations Lifting    Patient Stated Goals be able to easily bend and pick things up    Currently in Pain? No/denies                             [facility] Adult PT Treatment/Exercise - 04/17/20 0001      Lumbar Exercises: Stretches   Active Hamstring Stretch Right;Left;1 rep;30 seconds    Double Knee to Chest Stretch 2 reps;30 seconds      Lumbar Exercises: Aerobic   Stationary Bike 6 min level 4 while assessing patient      Lumbar Exercises: Standing   Other Standing Lumbar Exercises spilt stance moving the blue band back and forth keeping pelvis stable to work obliques 15 times each way      Lumbar Exercises: Supine   Dead Bug 20 reps;1 second    Dead Bug Limitations abdominal bracing    Bridge with clamshell 20 reps;1 second    Bridge with [employer] Limitations with red band       Lumbar Exercises: Quadruped   Madcat/Old Horse 15 reps    Opposite Arm/Leg Raise Right arm/Left leg;Left arm/Right leg;10 reps;1 second    Opposite Arm/Leg Raise Limitations needed tactile cues to keep neutral      Knee/Hip Exercises: Prone   Hip Extension Strengthening;Right;Left;2 sets;10 reps    Hip Extension Limitations toe on mat with focus on gluteal contraction      Manual Therapy   Manual Therapy Soft tissue mobilization    Soft tissue mobilization using the addaday to the lumbar muscles in prone                  PT Education - 04/17/20 1608    Education Details Access Code: [email or web address]    Person(s) Educated Patient    Methods Explanation;Demonstration;Verbal cues;Handout    Comprehension Returned demonstration;Verbalized understanding            PT Short Term Goals - 04/10/20 1707      PT SHORT TERM GOAL #1   Title ind with intitial HEP    Time 4    Period Weeks    Status Achieved    Target Date 04/11/20  PT Long Term Goals - 04/17/20 1609      PT LONG TERM GOAL #1   Title able to bend and pick up something off the floor with 75% more ease    Time 12    Period Weeks    Status On-going      PT LONG TERM GOAL #2   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 at least 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    Baseline no pain today    Time 12    Period Weeks    Status On-going      PT LONG TERM GOAL #3   Title Pt will be able to do 30 min walk with jogging intervals and knows how to progress as part of exercise routine    Time 12    Period Weeks    Status On-going      PT LONG TERM GOAL #4   Title Ind with advanced HEP to maintain core strength and pelvic stability during exercise    Time 12    Period Weeks    Status On-going      PT LONG TERM GOAL #5   Title pt will be able to perform SLS x30 sec bilat without Trendelenburg or instability for endurance needed during running activities    Time 12    Period Weeks    Status  On-going                 Plan - 04/17/20 1610    Clinical Impression Statement Patient is able to tolerate the day with less tightness in his back. He has advanced his HEP with core workout due to increased strength. Patient was able to be quadruped to left opposite extremity with some instability in the pelvis and not able to fully extend his hips. Patient will benefit from skilled therapy to work on core stabilization.    Personal Factors and Comorbidities Comorbidity 1;Time since onset of injury/illness/exacerbation    Comorbidities L5 discectomy    Examination-Activity Limitations Lift;Bend;Other;Stand    Stability/Clinical Decision Making Stable/Uncomplicated    Rehab Potential Good    PT Frequency 1x / week    PT Duration 12 weeks    PT Treatment/Interventions ADLs/Self Care Home Management;Biofeedback;Cryotherapy;Electrical Stimulation;Moist Heat;Traction;Gait training;Therapeutic activities;Therapeutic exercise;Neuromuscular re-education;Manual techniques;Patient/family education;Passive range of motion;Dry needling;Taping    PT Next Visit Plan wall plank; single leg stance without trendelenberg; discuss walking program    Consulted and Agree with Plan of Care Patient           Patient will benefit from skilled therapeutic intervention in order to improve the following deficits and impairments:  Pain, Increased muscle spasms, Impaired flexibility, Decreased strength  Visit Diagnosis: Muscle spasm of back  Other muscle spasm  Muscle weakness (generalized)     Problem List There are no problems to display for this patient.   [name], PT 04/17/20 4:14 PM   [facility] [address] [place], [place], [address] Phone: [phone]   Fax:  [phone]  Name: Dennis Joseph MRN: [id number] Date of Birth: 1980/11/13

## 2020-04-17 NOTE — Patient Instructions (Signed)
Access Code: [email or web address] URL: [email or web address] Date: 04/17/2020 Prepared by: [name]  Program Notes mamastafit  instagram   Exercises Clam - 1 x daily - 7 x weekly - 3 sets - 10 reps Supine Figure 4 Piriformis Stretch with Leg Extension - 1 x daily - 7 x weekly - 3 sets - 10 reps Seated Hamstring Stretch - 1 x daily - 7 x weekly - 3 reps - 1 sets - 30 sec hold Seated Pelvic Floor Contraction - 3 x daily - 7 x weekly - 1 sets - 10 reps Standard Lunge - 1 x daily - 7 x weekly - 1 sets - 10 reps Prone Terminal Knee Extension - 1 x daily - 7 x weekly - 1 sets - 10 reps - 3 sec hold Bridge with Hip Abduction and Resistance - 1 x daily - 7 x weekly - 2 sets - 10 reps Half Kneeling Anti-Rotation Press - Forward Leg Anchor Side - 1 x daily - 7 x weekly - 3 sets - 10 reps  Patient Education Trigger [facility] Dry Needling [facility] [address] [place], [place] [address] Phone # [phone] Fax [phone]

## 2020-04-30 ENCOUNTER — Encounter: Payer: BC Managed Care – PPO | Admitting: Physical Therapy

## 2020-05-02 ENCOUNTER — Encounter: Payer: Self-pay | Admitting: Physical Therapy

## 2020-05-02 ENCOUNTER — Ambulatory Visit: Payer: BC Managed Care – PPO | Attending: Neurological Surgery | Admitting: Physical Therapy

## 2020-05-02 ENCOUNTER — Other Ambulatory Visit: Payer: Self-pay

## 2020-05-02 DIAGNOSIS — M62838 Other muscle spasm: Secondary | ICD-10-CM

## 2020-05-02 DIAGNOSIS — M6281 Muscle weakness (generalized): Secondary | ICD-10-CM | POA: Insufficient documentation

## 2020-05-02 DIAGNOSIS — M62830 Muscle spasm of back: Secondary | ICD-10-CM | POA: Insufficient documentation

## 2020-05-02 NOTE — Patient Instructions (Signed)
Access Code: [email or web address]: [email or web address]: 09/09/2021Prepared by: [facility] Notes mamastafit instagram Exercises  Clam - 1 x daily - 7 x weekly - 3 sets - 10 reps  Seated Hamstring Stretch - 1 x daily - 7 x weekly - 3 reps - 1 sets - 30 sec hold  Seated Pelvic Floor Contraction - 3 x daily - 7 x weekly - 1 sets - 10 reps  Bridge with Hip Abduction and Resistance - 1 x daily - 7 x weekly - 2 sets - 10 reps  Dead Bug - 1 x daily - 7 x weekly - 2 sets - 5 reps  Seated Piriformis Stretch with Trunk Bend - 2 x daily - 7 x weekly - 5 reps - 5 seconds hold  Seated Piriformis Stretch - 2 x daily - 7 x weekly - 5 reps - 5 seconds hold  [facility] [address] [place], [place] [address] Phone # [phone] Fax [phone]

## 2020-05-02 NOTE — Therapy (Signed)
[facility] [address] [place], [place], [address] Phone: [phone]   Fax:  [phone]  Physical Therapy Treatment  Patient Details  Name: Dennis Joseph MRN: [id number] Date of Birth: 1981-03-10 Referring Provider (PT): Jadene Pierini, MD   Encounter Date: 05/02/2020   PT End of Session - 05/02/20 1700    Visit Number 5    Date for PT Re-Evaluation 06/06/20    Authorization Type bcbs    PT Start Time 1616    PT Stop Time 1658    PT Time Calculation (min) 42 min    Activity Tolerance Patient tolerated treatment well;No increased pain    Behavior During Therapy WFL for tasks assessed/performed           Past Medical History:  Diagnosis Date  . Sciatica     Past Surgical History:  Procedure Laterality Date  . lumbar discectomy L5 Bilateral 02/07/2020    There were no vitals filed for this visit.   Subjective Assessment - 05/02/20 1617    Subjective Pt states that things are going well. No pain currently. He just has some tightness.    Pertinent History 02/07/20 lumbar discectomy L5    Limitations Lifting    Patient Stated Goals be able to easily bend and pick things up    Currently in Pain? No/denies                             [facility] Adult PT Treatment/Exercise - 05/02/20 0001      Lumbar Exercises: Stretches   Active Hamstring Stretch Left;Right;2 reps    Active Hamstring Stretch Limitations ankle DF/PF x5 reps during hold at knee extension    Other Lumbar Stretch Exercise seated figure 4 proximal sciatic nerve flossing 5x5 sec     Other Lumbar Stretch Exercise cat/cow x15 reps       Lumbar Exercises: Aerobic   Stationary Bike L4 x6 min- PT present to discuss progress       Lumbar Exercises: Supine   Dead Bug 5 reps    Dead Bug Limitations abdominal bracing: x2 sets each side    Bridge 10 reps    Bridge Limitations red TB around knees, x2 sets       Lumbar Exercises:  Quadruped   Opposite Arm/Leg Raise Left arm/Right leg;Right arm/Left leg;5 reps    Opposite Arm/Leg Raise Limitations tactile cues to increase Rt glute activation       Manual Therapy   Soft tissue mobilization Lt and Rt lumbar paraspinals, gluteals using addaday- pt in prone                   PT Education - 05/02/20 1700    Education Details updated HEP    Person(s) Educated Patient    Methods Explanation;Handout;Verbal cues    Comprehension Verbalized understanding;Returned demonstration            PT Short Term Goals - 04/10/20 1707      PT SHORT TERM GOAL #1   Title ind with intitial HEP    Time 4    Period Weeks    Status Achieved    Target Date 04/11/20             PT Long Term Goals - 04/17/20 1609      PT LONG TERM GOAL #1   Title able to bend and pick up something off the floor with 75%  more ease    Time 12    Period Weeks    Status On-going      PT LONG TERM GOAL #2   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 at least 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    Baseline no pain today    Time 12    Period Weeks    Status On-going      PT LONG TERM GOAL #3   Title Pt will be able to do 30 min walk with jogging intervals and knows how to progress as part of exercise routine    Time 12    Period Weeks    Status On-going      PT LONG TERM GOAL #4   Title Ind with advanced HEP to maintain core strength and pelvic stability during exercise    Time 12    Period Weeks    Status On-going      PT LONG TERM GOAL #5   Title pt will be able to perform SLS x30 sec bilat without Trendelenburg or instability for endurance needed during running activities    Time 12    Period Weeks    Status On-going                 Plan - 05/02/20 1700    Clinical Impression Statement Pt is making steady progress towards his goals. He has intermittent low back tightness, but this is alleviated with his cat/cow stretch throughout the day. Session continued with  focus on increasing trunk strength and endurance. Pt had lateral shift during quadruped UE/LE reach secondary to Rt glute weakness. This was improved with PT tactile cuing. Discussed gradually increasing walking distance with a weekly walking program. Ended with soft tissue mobilization to improve muscle tension in the glutes/lumbar region.    Personal Factors and Comorbidities Comorbidity 1;Time since onset of injury/illness/exacerbation    Comorbidities L5 discectomy    Examination-Activity Limitations Lift;Bend;Other;Stand    Stability/Clinical Decision Making Stable/Uncomplicated    Rehab Potential Good    PT Frequency 1x / week    PT Duration 12 weeks    PT Treatment/Interventions ADLs/Self Care Home Management;Biofeedback;Cryotherapy;Electrical Stimulation;Moist Heat;Traction;Gait training;Therapeutic activities;Therapeutic exercise;Neuromuscular re-education;Manual techniques;Patient/family education;Passive range of motion;Dry needling;Taping    PT Next Visit Plan plank progressions; single leg stance without trendelenberg    Consulted and Agree with Plan of Care Patient           Patient will benefit from skilled therapeutic intervention in order to improve the following deficits and impairments:  Pain, Increased muscle spasms, Impaired flexibility, Decreased strength  Visit Diagnosis: Muscle spasm of back  Other muscle spasm  Muscle weakness (generalized)     Problem List There are no problems to display for this patient.  5:05 PM,05/02/20 Donita Brooks PT, DPT [facility]  [phone]  [facility] [address] [place], [place], [address] Phone: [phone]   Fax:  [phone]  Name: Bartlett Enke MRN: [id number] Date of Birth: [birth year]

## 2020-05-08 ENCOUNTER — Other Ambulatory Visit: Payer: Self-pay

## 2020-05-08 ENCOUNTER — Ambulatory Visit: Payer: BC Managed Care – PPO

## 2020-05-08 DIAGNOSIS — M6281 Muscle weakness (generalized): Secondary | ICD-10-CM

## 2020-05-08 DIAGNOSIS — M62830 Muscle spasm of back: Secondary | ICD-10-CM | POA: Diagnosis not present

## 2020-05-08 DIAGNOSIS — M62838 Other muscle spasm: Secondary | ICD-10-CM

## 2020-05-08 NOTE — Therapy (Signed)
[facility] [address], [address] [place], [place], [address] Phone: [phone]   Fax:  [phone]  Physical Therapy Treatment  Patient Details  Name: Dennis Joseph MRN: [id number] Date of Birth: Jun 01, 1981 Referring Provider (PT): Jadene Pierini, MD   Encounter Date: 05/08/2020   PT End of Session - 05/08/20 1651    Visit Number 6    Date for PT Re-Evaluation 06/06/20    Authorization Type bcbs    PT Start Time 1614    PT Stop Time 1701    PT Time Calculation (min) 47 min    Activity Tolerance Patient tolerated treatment well;No increased pain    Behavior During Therapy WFL for tasks assessed/performed           Past Medical History:  Diagnosis Date  . Sciatica     Past Surgical History:  Procedure Laterality Date  . lumbar discectomy L5 Bilateral 02/07/2020    There were no vitals filed for this visit.   Subjective Assessment - 05/08/20 1617    Subjective I havent been doing much cardio.    Pertinent History 02/07/20 lumbar discectomy L5    Currently in Pain? No/denies                             [facility] Adult PT Treatment/Exercise - 05/08/20 0001      Exercises   Exercises Lumbar      Lumbar Exercises: Stretches   Active Hamstring Stretch Left;Right;2 reps    Active Hamstring Stretch Limitations with strap    Figure 4 Stretch 3 reps;20 seconds      Lumbar Exercises: Aerobic   Stationary Bike L4 x6 min- PT present to discuss progress       Lumbar Exercises: Supine   Bridge 10 reps    Bridge Limitations blue loop around thighs      Lumbar Exercises: Sidelying   Clam Both;15 reps    Clam Limitations blue loop around thighs      Lumbar Exercises: Quadruped   Opposite Arm/Leg Raise Left arm/Right leg;Right arm/Left leg;5 reps    Opposite Arm/Leg Raise Limitations tactile cues to increase Rt glute activation       Manual Therapy   Soft tissue mobilization Lt and Rt lumbar  paraspinals, gluteals using addaday- pt in prone                     PT Short Term Goals - 04/10/20 1707      PT SHORT TERM GOAL #1   Title ind with intitial HEP    Time 4    Period Weeks    Status Achieved    Target Date 04/11/20             PT Long Term Goals - 05/08/20 1621      PT LONG TERM GOAL #1   Title able to bend and pick up something off the floor with 75% more ease    Baseline uses legs and is able to do this    Time 12    Period Weeks    Status Achieved      PT LONG TERM GOAL #2   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  Pt has been moderately compliant with HEP and reports that he needs to stretch more.  Pt with a 75% reduction in spasms and is now able to pick up objects from the floor with improved mechanics and reduced pain.  Pt with core fatigue with quadruped exercises today.  Pt will continue to benefit from skilled PT to address core strength, endurance and flexibility s/p lumbar surgery.    PT Frequency 1x / week    PT Duration 12 weeks    PT Treatment/Interventions ADLs/Self Care Home Management;Biofeedback;Cryotherapy;Electrical Stimulation;Moist Heat;Traction;Gait training;Therapeutic activities;Therapeutic exercise;Neuromuscular re-education;Manual techniques;Patient/family education;Passive range of motion;Dry needling;Taping    PT Next Visit Plan plank progressions; single leg stance without trendelenberg    PT Home Exercise Plan Access Code: [email or web address] and dry needling info    Consulted and Agree with Plan of Care Patient           Patient will benefit from skilled therapeutic intervention in order to improve the following deficits and impairments:  Pain, Increased muscle spasms, Impaired flexibility,  Decreased strength  Visit Diagnosis: Muscle spasm of back  Other muscle spasm  Muscle weakness (generalized)     Problem List There are no problems to display for this patient.    Lorrene Reid, PT 05/08/20 5:06 PM  [facility] [address], [address] [place], [place], [address] Phone: [phone]   Fax:  [phone]  Name: Dennis Joseph MRN: [id number] Date of Birth: 03/16/1981

## 2020-05-15 ENCOUNTER — Ambulatory Visit: Payer: BC Managed Care – PPO

## 2020-05-15 ENCOUNTER — Other Ambulatory Visit: Payer: Self-pay

## 2020-05-15 DIAGNOSIS — M62830 Muscle spasm of back: Secondary | ICD-10-CM | POA: Diagnosis not present

## 2020-05-15 DIAGNOSIS — M62838 Other muscle spasm: Secondary | ICD-10-CM

## 2020-05-15 DIAGNOSIS — M6281 Muscle weakness (generalized): Secondary | ICD-10-CM

## 2020-05-15 NOTE — Therapy (Signed)
[facility] [address] [place], [place], [address] Phone: [phone]   Fax:  [phone]  Physical Therapy Treatment  Patient Details  Name: Dennis Joseph MRN: [id number] Date of Birth: Aug 20, 1981 Referring Provider (PT): Jadene Pierini, MD   Encounter Date: 05/15/2020   PT End of Session - 05/15/20 1658    Visit Number 7    Date for PT Re-Evaluation 06/06/20    Authorization Type bcbs    PT Start Time 1622    PT Stop Time 1658    PT Time Calculation (min) 36 min    Activity Tolerance Patient tolerated treatment well;No increased pain    Behavior During Therapy WFL for tasks assessed/performed           Past Medical History:  Diagnosis Date  . Sciatica     Past Surgical History:  Procedure Laterality Date  . lumbar discectomy L5 Bilateral 02/07/2020    There were no vitals filed for this visit.   Subjective Assessment - 05/15/20 1625    Subjective I haven't done my stretches as much this week due to being busy.  I can tell that I have not been doing them.    Pertinent History 02/07/20 lumbar discectomy L5    Currently in Pain? No/denies                             [facility] Adult PT Treatment/Exercise - 05/15/20 0001      Lumbar Exercises: Stretches   Active Hamstring Stretch Left;Right;2 reps    Active Hamstring Stretch Limitations with strap and seated    Lower Trunk Rotation 2 reps;20 seconds    Figure 4 Stretch 3 reps;20 seconds      Lumbar Exercises: Aerobic   Stationary Bike L4 x6 min- PT present to discuss progress       Lumbar Exercises: Sidelying   Clam Both;15 reps    Clam Limitations red theraband- issued for home      Lumbar Exercises: Prone   Straight Leg Raise 20 reps    Opposite Arm/Leg Raise Left arm/Right leg;Right arm/Left leg;20 reps      Manual Therapy   Soft tissue mobilization Lt and Rt lumbar paraspinals, gluteals using addaday- pt in prone                      PT Short Term Goals - 04/10/20 1707      PT SHORT TERM GOAL #1   Title ind with intitial HEP    Time 4    Period Weeks    Status Achieved    Target Date 04/11/20             PT Long Term Goals - 05/15/20 1633      PT LONG TERM GOAL #1   Title able to bend and pick up something off the floor with 75% more ease    Status Achieved      PT LONG TERM GOAL #2   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  Pt has not performed HEP over the past couple of days due to being busy.  Pt has not tried to return to power walking/jogging and PT encouraged pt to try this while still in PT so we can address any issues.  Pt demonstrated fatigue of core muscles as session progressed. Pt required tactile cues for alignment with exercise today. Pt will benefit from skilled PT to address core strength, endurance and flexibility s/p lumbar surgery.    PT Frequency 1x / week    PT Duration 12 weeks    PT Treatment/Interventions ADLs/Self Care Home Management;Biofeedback;Cryotherapy;Electrical Stimulation;Moist Heat;Traction;Gait training;Therapeutic activities;Therapeutic exercise;Neuromuscular re-education;Manual techniques;Patient/family education;Passive range of motion;Dry needling;Taping    PT Next Visit Plan plank, prone, sit to stand/deadlift    PT Home Exercise Plan Access Code: [email or web address] and dry needling info            Patient will benefit from skilled therapeutic intervention in order to improve the following deficits and impairments:  Pain, Increased muscle spasms, Impaired flexibility, Decreased strength  Visit Diagnosis: Muscle spasm of back  Other muscle spasm  Muscle weakness (generalized)     Problem List There are no problems to display for this patient.   Lorrene Reid, PT 05/15/20 4:59 PM  [facility] [address] [place], [place], [address] Phone: [phone]   Fax:  [phone]  Name: Dennis Joseph MRN: [id number] Date of Birth: 1981-02-19

## 2020-05-22 ENCOUNTER — Other Ambulatory Visit: Payer: Self-pay

## 2020-05-22 ENCOUNTER — Ambulatory Visit: Payer: BC Managed Care – PPO

## 2020-05-22 DIAGNOSIS — M6281 Muscle weakness (generalized): Secondary | ICD-10-CM

## 2020-05-22 DIAGNOSIS — M62838 Other muscle spasm: Secondary | ICD-10-CM

## 2020-05-22 DIAGNOSIS — M62830 Muscle spasm of back: Secondary | ICD-10-CM

## 2020-05-22 NOTE — Therapy (Signed)
[facility] [address] [place], [place], [address] Phone: [phone]   Fax:  [phone]  Physical Therapy Treatment  Patient Details  Name: Dennis Joseph MRN: [id number] Date of Birth: 1981/01/18 Referring Provider (PT): Jadene Pierini, MD   Encounter Date: 05/22/2020   PT End of Session - 05/22/20 1700    Visit Number 8    Date for PT Re-Evaluation 06/06/20    Authorization Type bcbs    PT Start Time 1616    PT Stop Time 1700    PT Time Calculation (min) 44 min    Activity Tolerance Patient tolerated treatment well;No increased pain    Behavior During Therapy WFL for tasks assessed/performed           Past Medical History:  Diagnosis Date  . Sciatica     Past Surgical History:  Procedure Laterality Date  . lumbar discectomy L5 Bilateral 02/07/2020    There were no vitals filed for this visit.   Subjective Assessment - 05/22/20 1618    Subjective I haven't done any regular walking for exercise, just walking between classes at work.    Currently in Pain? No/denies                             [facility] Adult PT Treatment/Exercise - 05/22/20 0001      Lumbar Exercises: Stretches   Active Hamstring Stretch Left;Right;2 reps;20 seconds    Active Hamstring Stretch Limitations seated    Figure 4 Stretch 3 reps;20 seconds    Figure 4 Stretch Limitations seated      Lumbar Exercises: Aerobic   Elliptical Level 2x 4 minutes, ramp 5- fatigue with this      Lumbar Exercises: Standing   Row Power tower;Strengthening;20 reps    Row Limitations 35#     Other Standing Lumbar Exercises pallof press: green 2x10 Rt and Lt     Other Standing Lumbar Exercises resisted walking 35# reverse with core engagement x 10      Lumbar Exercises: Supine   Bridge 10 reps;5 seconds      Lumbar Exercises: Sidelying   Clam Both;15 reps    Clam Limitations red theraband- issued for home    Other  Sidelying Lumbar Exercises open book x10 bil.       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  Pt has been doing some of the exercises.  Pt has not tried to return to power walking/jogging and PT encouraged pt to try this while still in PT so we can address any issues. Pt fatigued with elliptical today and demonstrated fatigue of core muscles as session progressed. Pt required tactile cues for alignment with exercise today. Pt will benefit from skilled PT to address core strength, endurance, and flexibility s/p lumbar surgery.    PT Frequency 1x / week    PT Duration 12 weeks    PT Treatment/Interventions ADLs/Self Care Home Management;Biofeedback;Cryotherapy;Electrical Stimulation;Moist Heat;Traction;Gait training;Therapeutic activities;Therapeutic exercise;Neuromuscular re-education;Manual techniques;Patient/family education;Passive range of motion;Dry needling;Taping    PT Next Visit Plan plank, prone, sit to stand/deadlift    PT Home Exercise Plan Access Code: [email or web address] and dry needling info    Consulted and Agree with Plan of Care Patient           Patient will benefit from skilled  therapeutic intervention in order to improve the following deficits and impairments:  Pain, Increased muscle spasms, Impaired flexibility, Decreased strength  Visit Diagnosis: Other muscle spasm  Muscle spasm of back  Muscle weakness (generalized)     Problem List There are no problems to display for this patient.    Lorrene Reid, PT 05/22/20 5:02 PM  [facility] [address] [place], [place], [address] Phone: [phone]   Fax:  [phone]  Name: Dennis Joseph MRN: [id number] Date of Birth: [birth year]

## 2020-05-29 ENCOUNTER — Other Ambulatory Visit: Payer: Self-pay

## 2020-05-29 ENCOUNTER — Ambulatory Visit: Payer: BC Managed Care – PPO | Attending: Neurological Surgery

## 2020-05-29 DIAGNOSIS — M6281 Muscle weakness (generalized): Secondary | ICD-10-CM | POA: Diagnosis present

## 2020-05-29 DIAGNOSIS — M62838 Other muscle spasm: Secondary | ICD-10-CM | POA: Diagnosis not present

## 2020-05-29 DIAGNOSIS — M62830 Muscle spasm of back: Secondary | ICD-10-CM | POA: Insufficient documentation

## 2020-05-29 NOTE — Therapy (Signed)
[facility] [address] [place], [place], [address] Phone: [phone]   Fax:  [phone]  Physical Therapy Treatment  Patient Details  Name: Milus Fritze MRN: [id number] Date of Birth: 10-Jul-1981 Referring Provider (PT): Jadene Pierini, MD   Encounter Date: 05/29/2020   PT End of Session - 05/29/20 1703    Visit Number 9    Date for PT Re-Evaluation 06/06/20    Authorization Type bcbs    PT Start Time 1630    PT Stop Time 1701    PT Time Calculation (min) 31 min    Activity Tolerance Patient tolerated treatment well;No increased pain    Behavior During Therapy WFL for tasks assessed/performed           Past Medical History:  Diagnosis Date  . Sciatica     Past Surgical History:  Procedure Laterality Date  . lumbar discectomy L5 Bilateral 02/07/2020    There were no vitals filed for this visit.   Subjective Assessment - 05/29/20 1632    Subjective I was a little tired after last session but not too sore.    Currently in Pain? No/denies                             [facility] Adult PT Treatment/Exercise - 05/29/20 0001      Lumbar Exercises: Stretches   Active Hamstring Stretch Left;Right;2 reps;20 seconds    Active Hamstring Stretch Limitations seated      Lumbar Exercises: Aerobic   Stationary Bike L4 x5 min- PT present to discuss progress       Lumbar Exercises: Standing   Row Power tower;Strengthening;20 reps    Row Limitations 35#    Other Standing Lumbar Exercises pallof press: green 2x10 Rt and Lt     Other Standing Lumbar Exercises resisted walking 35# reverse with core engagement x 10      Lumbar Exercises: Supine   Bridge 10 reps;5 seconds      Lumbar Exercises: Sidelying   Clam Both;15 reps    Clam Limitations red theraband- issued for home      Manual Therapy   Soft tissue mobilization Lt and Rt lumbar paraspinals, gluteals using addaday- pt in prone                      PT Short Term Goals - 04/10/20 1707      PT SHORT TERM GOAL #1   Title ind with intitial HEP    Time 4    Period Weeks    Status Achieved    Target Date 04/11/20             PT Long Term Goals - 05/29/20 1632      PT LONG TERM GOAL #3   Title Pt will be able to do 30 min walk with jogging intervals and knows how to progress as part of exercise routine    Baseline has not tried this    Status On-going                 Plan - 05/29/20 1639    Clinical Impression Statement Pt denies any pain, just lumbar stiffness today.  Pt has been doing some of the exercises, mostly stretching at night.  Pt has not tried to return to power walking/jogging and PT encouraged pt to try this while still in PT so we can address any issues.  Pt required tactile cues for alignment with exercise today. Pt will benefit from skilled PT to address core strength, endurance, and flexibility s/p lumbar surgery.    Rehab Potential Good    PT Frequency 1x / week    PT Duration 12 weeks    PT Treatment/Interventions ADLs/Self Care Home Management;Biofeedback;Cryotherapy;Electrical Stimulation;Moist Heat;Traction;Gait training;Therapeutic activities;Therapeutic exercise;Neuromuscular re-education;Manual techniques;Patient/family education;Passive range of motion;Dry needling;Taping    PT Next Visit Plan 1 more session, finalize HEP    PT Home Exercise Plan Access Code: [email or web address] and dry needling info    Consulted and Agree with Plan of Care Patient           Patient will benefit from skilled therapeutic intervention in order to improve the following deficits and impairments:  Pain, Increased muscle spasms, Impaired flexibility, Decreased strength  Visit Diagnosis: Other muscle spasm  Muscle spasm of back  Muscle weakness (generalized)     Problem List There are no problems to display for this patient.    Lorrene Reid, PT 05/29/20 5:04 PM  [facility] [address] [place], [place], [address] Phone: [phone]   Fax:  [phone]  Name: Darrio Bade MRN: [id number] Date of Birth: 10-09-1980

## 2020-06-05 ENCOUNTER — Other Ambulatory Visit: Payer: Self-pay

## 2020-06-05 ENCOUNTER — Ambulatory Visit: Payer: BC Managed Care – PPO

## 2020-06-05 DIAGNOSIS — M62838 Other muscle spasm: Secondary | ICD-10-CM | POA: Diagnosis not present

## 2020-06-05 DIAGNOSIS — M62830 Muscle spasm of back: Secondary | ICD-10-CM

## 2020-06-05 DIAGNOSIS — M6281 Muscle weakness (generalized): Secondary | ICD-10-CM

## 2020-06-05 NOTE — Therapy (Signed)
[facility] [address] [place], [place], [address] Phone: [phone]   Fax:  [phone]  Physical Therapy Treatment  Patient Details  Name: Dennis Joseph MRN: [id number] Date of Birth: 01-07-1981 Referring Provider (PT): Judith Part, MD   Encounter Date: 06/05/2020   PT End of Session - 06/05/20 1701    Visit Number 10    PT Start Time 6962    PT Stop Time 1701    PT Time Calculation (min) 37 min    Activity Tolerance Patient tolerated treatment well;No increased pain    Behavior During Therapy WFL for tasks assessed/performed           Past Medical History:  Diagnosis Date  . Sciatica     Past Surgical History:  Procedure Laterality Date  . lumbar discectomy L5 Bilateral 02/07/2020    There were no vitals filed for this visit.   Subjective Assessment - 06/05/20 1625    Subjective I am stiff, but doing well.  I've been walking a lot.    Currently in Pain? No/denies              [facility] PT Assessment - 06/05/20 0001      Assessment   Medical Diagnosis M54.16 (ICD-10-CM) - Radiculopathy, lumbar region    Referring Provider (PT) Judith Part, MD    Onset Date/Surgical Date 02/07/20                         [facility] Adult PT Treatment/Exercise - 06/05/20 0001      Lumbar Exercises: Stretches   Active Hamstring Stretch Left;Right;2 reps;20 seconds    Active Hamstring Stretch Limitations seated      Lumbar Exercises: Aerobic   Stationary Bike L4 x 6 min- PT present to discuss progress       Lumbar Exercises: Standing   Row Power tower;Strengthening;20 reps    Row Limitations 35#    Shoulder Extension Strengthening;Power Tower;20 reps    Shoulder Extension Limitations 35#    Other Standing Lumbar Exercises resisted walking 35# reverse with core engagement x 10      Lumbar Exercises: Supine   Bridge 10 reps;5 seconds      Manual Therapy   Soft tissue  mobilization Lt and Rt lumbar paraspinals, gluteals using addaday- pt in prone                     PT Short Term Goals - 04/10/20 1707      PT SHORT TERM GOAL #1   Title ind with intitial HEP    Time 4    Period Weeks    Status Achieved    Target Date 04/11/20             PT Long Term Goals - 06/05/20 1627      PT LONG TERM GOAL #1   Title able to bend and pick up something off the floor with 75% more ease    Status Achieved      PT LONG TERM GOAL #2   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 at least 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    Baseline reduced by 75% (frequency)    Status Achieved      PT LONG TERM GOAL #3   Title Pt will be able to do 30 min walk with jogging intervals and knows how to progress as part of exercise routine  Baseline walking a lot, but not a regular routine with this    Status Partially Met      PT LONG TERM GOAL #4   Title Ind with advanced HEP to maintain core strength and pelvic stability during exercise    Status Achieved      PT LONG TERM GOAL #5   Title pt will be able to perform SLS x30 sec bilat without Trendelenburg or instability for endurance needed during running activities    Baseline instability bilaterally but level pelvis                 Plan - 06/05/20 1638    Clinical Impression Statement Pt denies any pain, just lumbar stiffness today.  Pt has been doing some of the exercises, mostly stretching at night.  Pt has been doing some faster paced walking, nothing consistent and not yet jogging.  Pt reports >70% reduction in lumbar spasms with standing. Verbal review of all HEP with pt to discuss safe and appropriate progression.  Pt required tactile cues for alignment with exercise today. Pt will D/C to HEP today.    PT Next Visit Plan D/C PT to HEP    PT Home Exercise Plan Access Code: [email or web address] and dry needling info    Consulted and Agree with Plan of Care Patient           Patient will benefit from  skilled therapeutic intervention in order to improve the following deficits and impairments:     Visit Diagnosis: Other muscle spasm  Muscle spasm of back  Muscle weakness (generalized)     Problem List There are no problems to display for this patient.  PHYSICAL THERAPY DISCHARGE SUMMARY  Visits from Start of Care: 10  Current functional level related to goals / functional outcomes: See above for current status.     Remaining deficits: Pt with functional strength deficits that he continues to work on strength and stability.     Education / Equipment: HEP, posture/body mechanics Plan: Patient agrees to discharge.  Patient goals were partially met. Patient is being discharged due to being pleased with the current functional level.  ?????        Sigurd Sos, PT 06/05/20 5:03 PM  [facility] [address] [place], [place], [address] Phone: [phone]   Fax:  [phone]  Name: Dennis Joseph MRN: [id number] Date of Birth: 1981-06-04

## 2020-07-01 ENCOUNTER — Ambulatory Visit: Payer: BC Managed Care – PPO | Attending: Family

## 2020-07-01 DIAGNOSIS — Z23 Encounter for immunization: Secondary | ICD-10-CM

## 2020-09-24 NOTE — Progress Notes (Signed)
   Covid-19 Vaccination Clinic  Name:  Dennis Joseph    MRN: [id number] DOB: 08/14/1981  09/24/2020  Mr. Dennis Joseph was observed post Covid-19 immunization for 15 minutes without incident. He was provided with Vaccine Information Sheet and instruction to access the V-Safe system.   Mr. Dennis Joseph was instructed to call 911 with any severe reactions post vaccine: Marland Kitchen Difficulty breathing  . Swelling of face and throat  . A fast heartbeat  . A bad rash all over body  . Dizziness and weakness   Immunizations Administered    Name Date Dose VIS Date Route   Moderna Covid-19 Booster Vaccine 07/01/2020  1:00 PM 0.25 mL 06/12/2020 Intramuscular   Manufacturer: Moderna   Lot: [id number]   NDC: 00349-179-15

## 2021-05-30 ENCOUNTER — Other Ambulatory Visit: Payer: Self-pay | Admitting: Neurological Surgery

## 2021-05-30 DIAGNOSIS — M5416 Radiculopathy, lumbar region: Secondary | ICD-10-CM

## 2021-06-05 ENCOUNTER — Ambulatory Visit
Admission: RE | Admit: 2021-06-05 | Discharge: 2021-06-05 | Disposition: A | Discharge status: Home / Self Care | Payer: BC Managed Care – PPO | Source: Ambulatory Visit | Attending: Neurological Surgery | Admitting: Neurological Surgery

## 2021-06-05 DIAGNOSIS — M5416 Radiculopathy, lumbar region: Secondary | ICD-10-CM

## 2021-06-05 IMAGING — MR MR LUMBAR SPINE WO/W CM
4 of 7 series · 23 of 48 positions shown · IV contrast (MULTIHANCE)
Comparison: [DATE]

CLINICAL DATA: Low back pain, radiating to right buttocks and down
right leg

EXAM:
MRI LUMBAR SPINE WITHOUT AND WITH CONTRAST
TECHNIQUE: Multiplanar and multiecho pulse sequences of the lumbar spine were
obtained without and with intravenous contrast.
CONTRAST:  20mL MULTIHANCE GADOBENATE DIMEGLUMINE 529 MG/ML IV SOLN

[Series 5: T1 · sagittal · 4.0mm · 0.73mm/px · 5 of 17 slices shown]
[im 1/17]
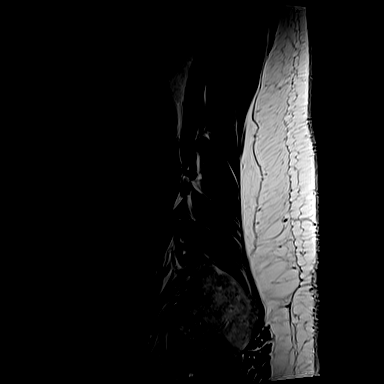
[im 5/17]
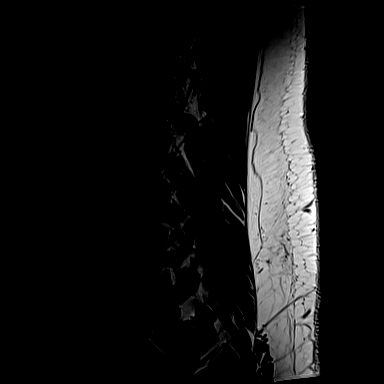
[im 9/17]
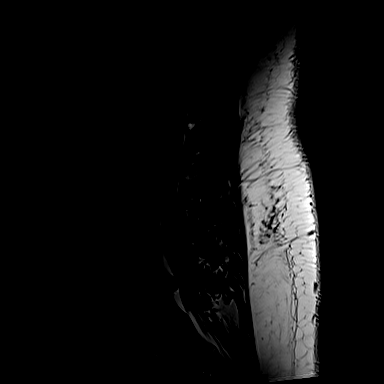
[im 13/17]
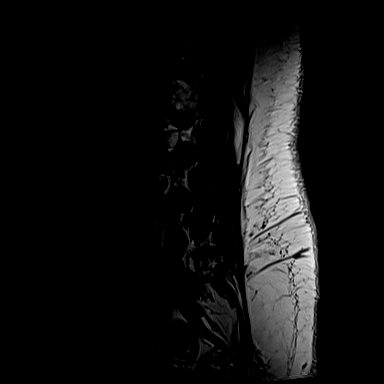
[im 17/17]
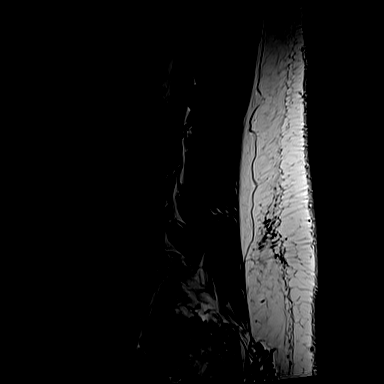

[Series 12: T2 · axial · 4.0mm · 0.28mm/px · z∈[-84,+136]mm · 9 of 42 slices shown (1 of 2)]
[im 1/42]
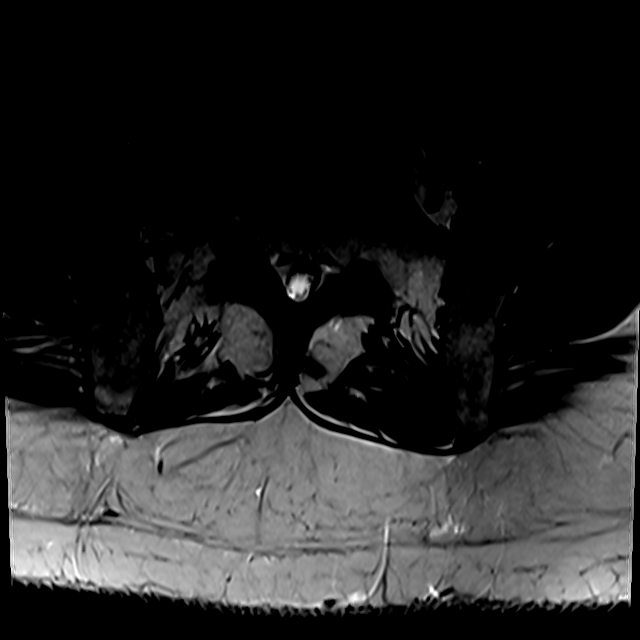
[im 8/42]
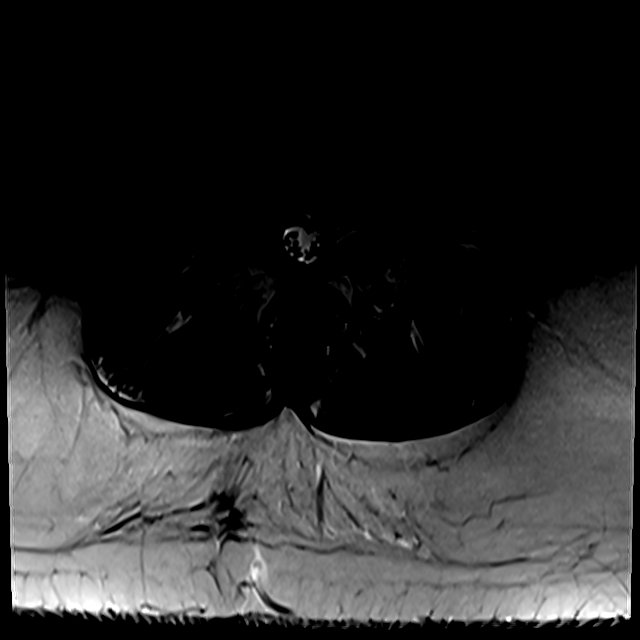
[im 12/42]
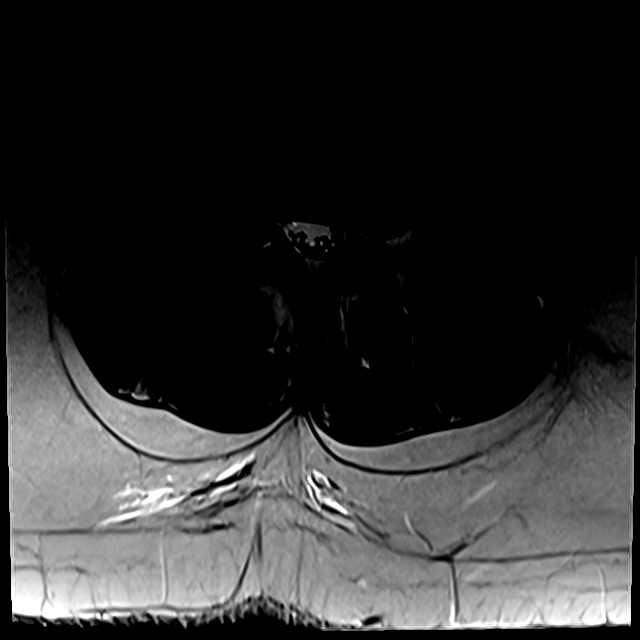
[im 19/42]
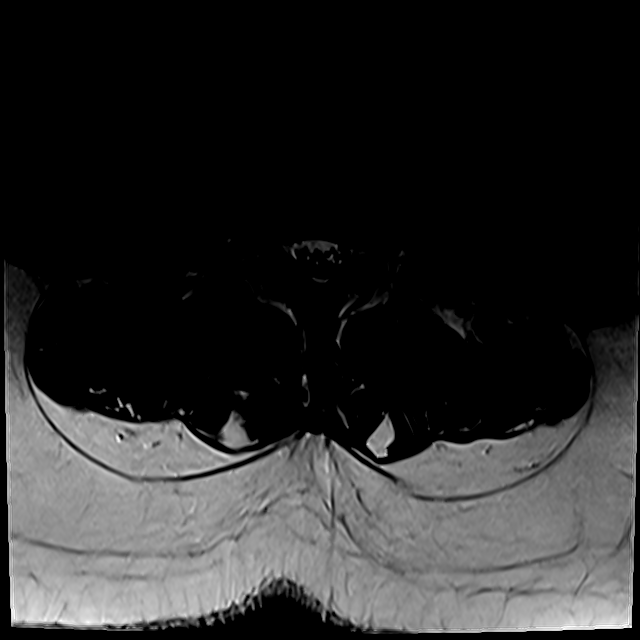
[im 23/42]
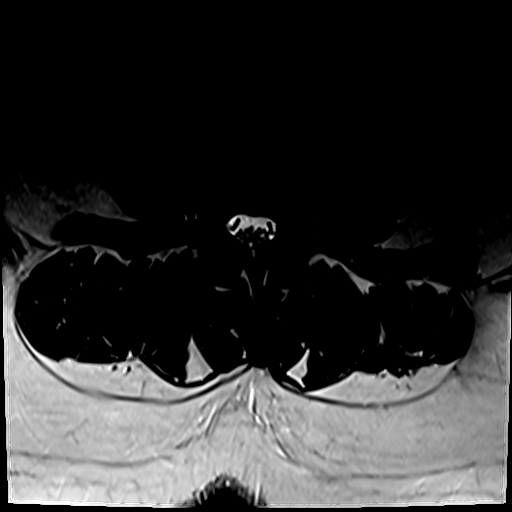
[im 30/42]
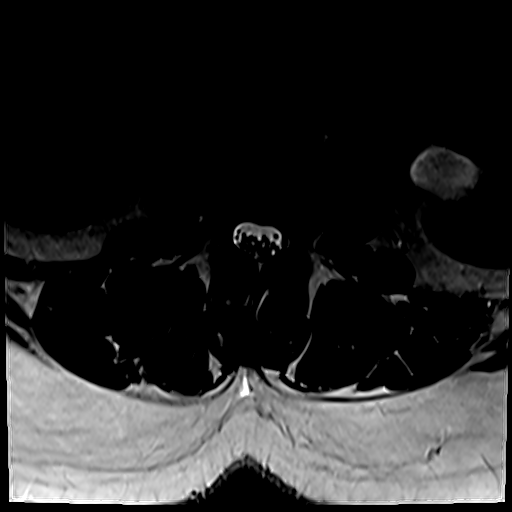
[im 34/42]
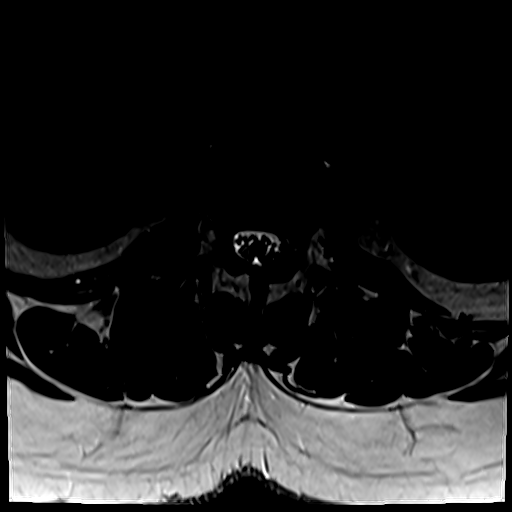
[im 38/42]
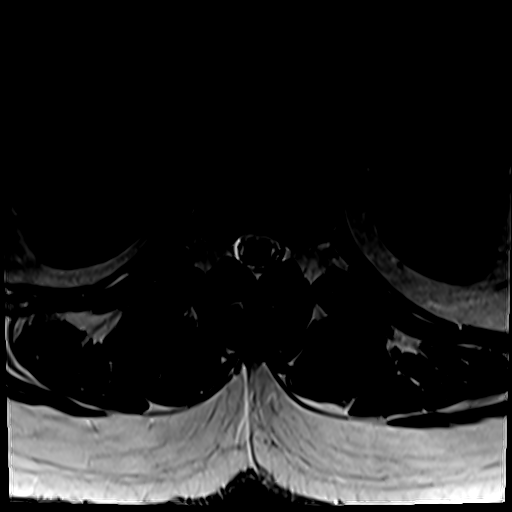
[im 42/42]
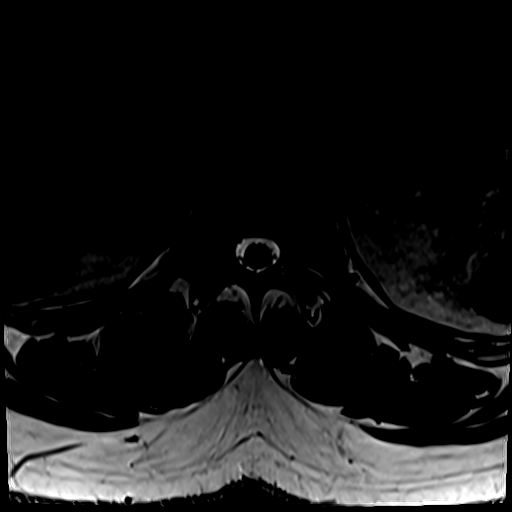

[Series 18: T2 · sagittal · 4.0mm · 0.73mm/px · 5 of 17 slices shown (2 of 2)]
[im 1/17]
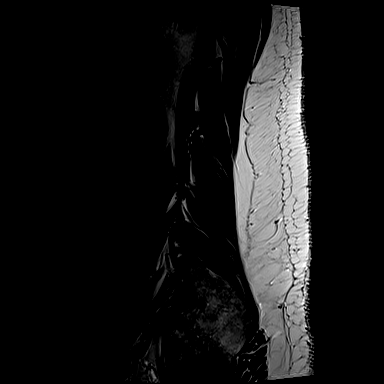
[im 5/17]
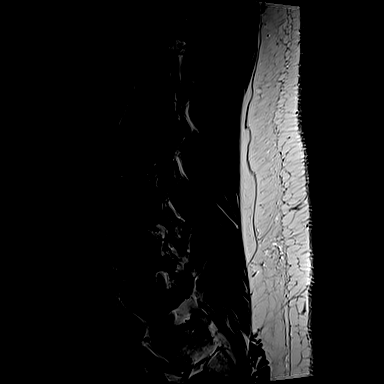
[im 9/17]
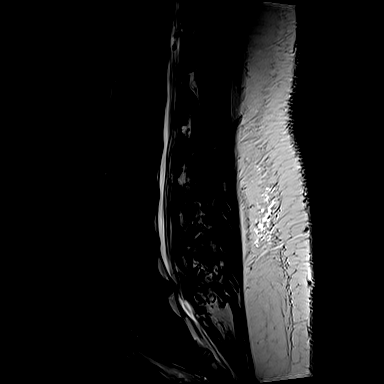
[im 13/17]
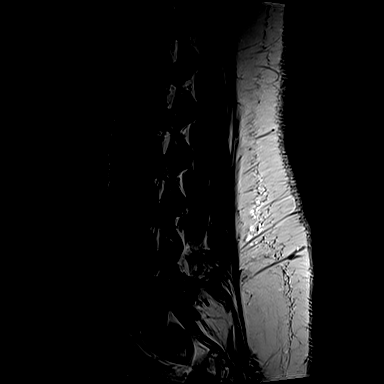
[im 17/17]
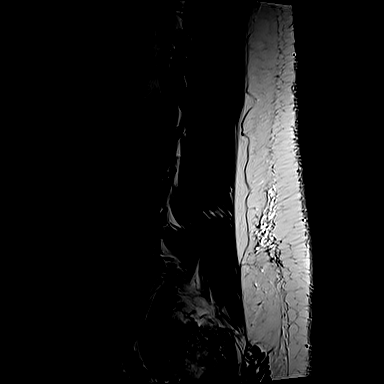

[Series 20: T1 fat-sat post-contrast · sagittal · 4.0mm · 0.73mm/px · 4 of 17 slices shown]
[im 1/17]
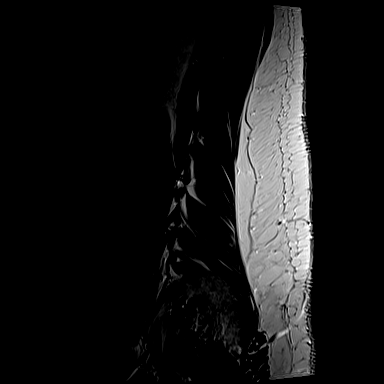
[im 5/17]
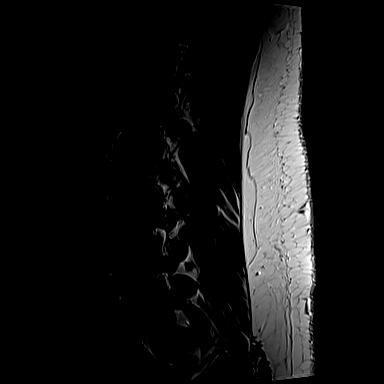
[im 9/17]
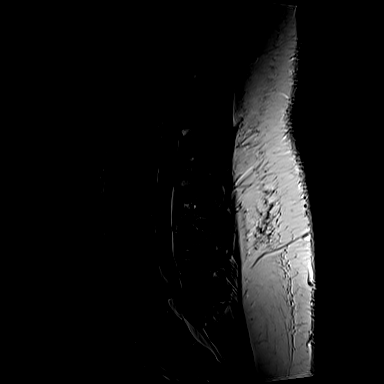
[im 17/17]
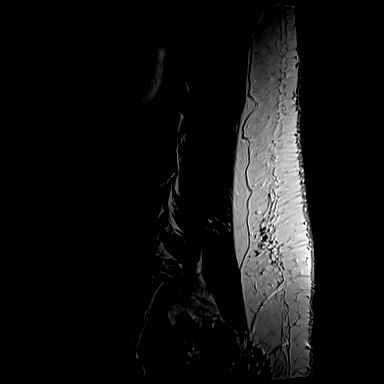

[23 of 48 positions shown; findings below may reference images not displayed]

FINDINGS: Segmentation: 5 lumbar type vertebral bodies, with partial
lumbarization of S1 and a rudimentary disc at S1-S2, in keeping with
the prior exam.

Alignment:  Physiologic.

Vertebrae: No fracture, evidence of discitis, or bone lesion. No
abnormal enhancement.

Conus medullaris and cauda equina: Conus extends to the L2 level.
Conus and cauda equina appear normal. No abnormal enhancement.

Paraspinal and other soft tissues: Negative.

Disc levels:

T12-L1: No significant disc bulge. No spinal canal stenosis or
neural foraminal narrowing.

L1-L2: No significant disc bulge. No spinal canal stenosis or neural
foraminal narrowing.

L2-L3: No significant disc bulge. No spinal canal stenosis or neural
foraminal narrowing.

L3-L4: No significant disc bulge. Minimal facet arthropathy. No
spinal canal stenosis or neural foraminal narrowing.

L4-L5: No significant disc bulge. Minimal facet arthropathy. No
spinal canal stenosis or neural foraminal narrowing.

L5-S1: Disc bulge with superimposed moderate right paracentral disc
protrusion, which causes moderate spinal canal stenosis and narrows
the lateral recesses, right-greater-than-left, and contacts the
descending right S1 and likely S2 nerves.

S1-S2: Rudimentary disc. No disc bulge, spinal canal stenosis, or
neural foraminal narrowing.
IMPRESSION: Patient is reportedly status post L5-S1 micro discectomy, with what
is presumed to be a new right paracentral disc protrusion, which
causes moderate spinal canal stenosis and contacts the descending
right S1 and likely S2 nerve.

## 2021-06-05 MED ORDER — GADOBENATE DIMEGLUMINE 529 MG/ML IV SOLN
20.0000 mL | Freq: Once | INTRAVENOUS | Status: AC | PRN
  Administered 2021-06-05: 20 mL via INTRAVENOUS

## 2021-09-02 ENCOUNTER — Other Ambulatory Visit: Payer: Self-pay

## 2021-09-02 ENCOUNTER — Ambulatory Visit: Payer: BC Managed Care – PPO | Attending: Neurological Surgery | Admitting: Physical Therapy

## 2021-09-02 DIAGNOSIS — M62830 Muscle spasm of back: Secondary | ICD-10-CM | POA: Diagnosis not present

## 2021-09-02 DIAGNOSIS — M6281 Muscle weakness (generalized): Secondary | ICD-10-CM | POA: Insufficient documentation

## 2021-09-02 DIAGNOSIS — M5416 Radiculopathy, lumbar region: Secondary | ICD-10-CM | POA: Diagnosis not present

## 2021-09-02 NOTE — Patient Instructions (Signed)
Access Code: [email or web address] URL: [email or web address] Date: 09/02/2021 Prepared by: Lavinia Sharps  Exercises Hooklying Single Knee to Chest Stretch - 1 x daily - 7 x weekly - 1 sets - 3 reps - 20 hold Abdominal Bracing - 1 x daily - 7 x weekly - 1 sets - 10 reps Quadruped Rocking Backward - 1 x daily - 7 x weekly - 1 sets - 10 reps Cat Cow - 1 x daily - 7 x weekly - 1 sets - 10 reps Bird Dog Progression - 1 x daily - 7 x weekly - 1 sets - 10 reps Clamshell (Mirrored) - 1 x daily - 7 x weekly - 1 sets - 10 reps

## 2021-09-02 NOTE — Therapy (Signed)
[facility] [place], [place], [address] Phone: [phone]   Fax:  [phone]  Physical Therapy Evaluation  Patient Details  Name: Dennis Joseph MRN: [id number] Date of Birth: 13-Nov-1980 Referring Provider (PT): Dr. Emelda Brothers   Encounter Date: 09/02/2021   PT End of Session - 09/02/21 2154     Visit Number 1    Date for PT Re-Evaluation 10/28/21    Authorization Type BCBS    PT Start Time 1610    PT Stop Time 1655    PT Time Calculation (min) 45 min    Activity Tolerance Patient tolerated treatment well             Past Medical History:  Diagnosis Date   Sciatica     Past Surgical History:  Procedure Laterality Date   lumbar discectomy L5 Bilateral 02/07/2020    There were no vitals filed for this visit.    Subjective Assessment - 09/02/21 1612     Subjective Had second lumbar discectomy L5 on 07/14/21.  Previous surgery 02/07/20.  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  Tightness in back.    How long can you sit comfortably? sitting usually stiff with rising    Patient Stated Goals wants to get back to cardio and safe lifting; get back to running    Currently in Pain? No/denies    Pain Score 0-No pain   tightness left > right   Pain Location Back    Pain Orientation Left;Right    Pain Descriptors / Indicators Tightness    Pain Type Surgical pain    Pain Radiating Towards right muscle spasms in past; right thigh pain in past    Pain Onset More than a month ago    Pain Frequency Constant    Aggravating Factors  nervous with bending    Pain Relieving Factors chair massage; cautious bending                [facility] PT Assessment - 09/02/21 0001       Assessment   Medical Diagnosis post surgical L5 discectomy    Referring Provider (PT) Dr. Emelda Brothers    Onset Date/Surgical Date 07/14/21    Next MD Visit 09/11/21    Prior Therapy after 1st surgery       Precautions   Precautions None    Precaution Comments initial restrictions on bending      Restrictions   Weight Bearing Restrictions No      Balance Screen   Has the patient fallen in the past 6 months No    Has the patient had a decrease in activity level because of a fear of falling?  No    Is the patient reluctant to leave their home because of a fear of falling?  No      Home Ecologist residence      Prior Function   Level of Independence Independent with basic ADLs   can take trash out, folding clothes   Vocation Requirements lawyer, sitting, stand, walk   also teaches at [employer]   Leisure runner long distance, 5K      Observation/Other Assessments   Observations surgical incision appears well healed    Focus on Therapeutic Outcomes (FOTO)  57%      Posture/Postural Control   Posture/Postural Control No significant limitations      AROM   Overall AROM Comments unable to  get hips flat in prone; absent lumbar extension in prone on elbows; avoids hip hinge and spinal flexion with picking up object from floor    Lumbar Flexion 40    Lumbar Extension 15    Lumbar - Right Side Bend 32    Lumbar - Left Side Bend 32      Strength   Overall Strength Comments able to rise sit to stand without UE assist with ease; single leg stance on right <5 sec;symmetrical heel raise and toe raise;  difficulty with stability with bird dog position    Right Hip ABduction 4/5    Left Hip ABduction 5/5    Lumbar Flexion 4-/5    Lumbar Extension 4-/5      Flexibility   Soft Tissue Assessment /Muscle Length yes    Hamstrings bil 50 degrees    Quadriceps decreased hip flexors bil      FABER test   findings Negative      Slump test   Findings Negative      Prone Knee Bend Test   Findings Negative      Straight Leg Raise   Findings Negative                        Objective measurements completed on examination: See above findings.                 PT Education - 09/02/21 2153     Education Details KTC; abdominal brace; cat/cow; bird dog; quadruped rocking ; clam    Person(s) Educated Patient    Methods Explanation;Demonstration;Handout    Comprehension Returned demonstration;Verbalized understanding              PT Short Term Goals - 09/02/21 2214       PT SHORT TERM GOAL #1   Title ind with intitial HEP    Time 4    Period Weeks    Status New    Target Date 09/30/21      PT SHORT TERM GOAL #2   Title Patient will have lumbar and hip mobility to demonstrate hip hinge method for picking up small objects and prepare for return to lifting    Time 4    Period Weeks    Status New      PT SHORT TERM GOAL #3   Title Improved lumbar flexion to 50 degrees, extension to 20 degrees needed for mobility with ADLs at home and work    Time 4    Period Weeks    Status New      PT SHORT TERM GOAL #4   Title HS length improved to 60 degrees bil needed for bending/stooping, gait for longer distances    Time 4    Period Weeks    Status New               PT Long Term Goals - 09/02/21 2218       PT LONG TERM GOAL #1   Title able to bend and pick up something off the floor with 75% more ease    Time 8    Period Weeks    Status New    Target Date 10/28/21      PT LONG TERM GOAL #2   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    Time 8    Period Weeks    Status New  PT LONG TERM GOAL #3   Title Improved right hip abduction strength and trunk strength to grossly 4+/5 needed for future return to jogging    Time 8    Period Weeks    Status New      PT LONG TERM GOAL #4   Title Ind with advanced HEP to maintain core strength and pelvic stability during exercise    Time 8    Period Weeks    Status New      PT LONG TERM GOAL #5   Title pt will be able to perform SLS x15  sec bilat without Trendelenburg or instability for  endurance needed during running activities    Time 8    Period Weeks    Status New      Additional Long Term Goals   Additional Long Term Goals Yes      PT LONG TERM GOAL #6   Title FOTO score improved to 62% indicating improved function with less pain    Time 8    Period Weeks    Status New                    Plan - 09/02/21 2155     Clinical Impression Statement The patient presents s/p L5 discectomy on 07/14/21.   He had a previous discectomy  02/07/20 at the same level.  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  He reports minimal pain, mostly LEFT sided lumbar tightness.  His surgery and his symptoms have primarily been on the RIGHT.  He has been cautious with bending and return to exercise unsure what was appropriate for this stage post op.  Limited lumbar ROM:  flexion 40 degrees, extension 15 degrees, sidebending 32 degrees.  Dificulty with right single leg standing < 5 sec secondary to core and hip weakness.  Right hip abduction strength 4/5.  Decreased activation of transverse abdominus muscles without Valsalva.  Abdominals and trunk extensors grossly 4-/5.  Decreased bil HS and hip flexor muscle lengths.  Unable to fully extend hips in prone.  No neural signs.  FOTO score is 57%.  He would benefit from PT to promote healing, progressive strengthening for return to full function and instruct in strategies to reduce risk of future recurrence.    Personal Factors and Comorbidities Past/Current Experience;Time since onset of injury/illness/exacerbation    Examination-Activity Limitations Lift;Bend;Locomotion Level;Stand    Examination-Participation Restrictions Occupation;Community Activity    Stability/Clinical Decision Making Stable/Uncomplicated    Clinical Decision Making Low    Rehab Potential Good    PT Frequency 2x / week    PT Duration 8 weeks    PT Treatment/Interventions ADLs/Self  Care Home Management;Aquatic Therapy;Cryotherapy;Electrical Stimulation;Ultrasound;Moist Heat;Traction;Iontophoresis 4mg /ml Dexamethasone;Therapeutic activities;Therapeutic exercise;Neuromuscular re-education;Manual techniques;Patient/family education;Dry needling;Taping    PT Next Visit Plan s/p lumbar discectomy 07/14/21 (2nd surgery); review lumbar and hip mobility ex's and progress (add low level HS stretching, hip flexor stretching and prone partial press up);  right glute med strengthening;  core strengthening; cardio; instruct in hip hinge    Consulted and Agree with Plan of Care Patient             Patient will benefit from skilled therapeutic intervention in order to improve the following deficits and impairments:  Pain, Decreased range of motion, Decreased strength, Decreased activity tolerance, Increased muscle spasms, Impaired flexibility  Visit Diagnosis: Radiculopathy, lumbar  region - Plan: PT plan of care cert/re-cert  Muscle spasm of back - Plan: PT plan of care cert/re-cert  Muscle weakness (generalized) - Plan: PT plan of care cert/re-cert     Problem List There are no problems to display for this patient.  Ruben Im, PT 09/02/21 10:25 PM Phone: [phone] Fax: [phone]  Alvera Singh, PT 09/02/2021, 10:25 PM  [facility] @ [place], [place], [address] Phone: [phone]   Fax:  [phone]  Name: Dennis Joseph MRN: [id number] Date of Birth: May 12, 1981

## 2021-09-16 ENCOUNTER — Ambulatory Visit: Payer: BC Managed Care – PPO

## 2021-09-16 ENCOUNTER — Other Ambulatory Visit: Payer: Self-pay

## 2021-09-16 DIAGNOSIS — M6281 Muscle weakness (generalized): Secondary | ICD-10-CM

## 2021-09-16 DIAGNOSIS — M62830 Muscle spasm of back: Secondary | ICD-10-CM

## 2021-09-16 DIAGNOSIS — M62838 Other muscle spasm: Secondary | ICD-10-CM

## 2021-09-16 DIAGNOSIS — M5416 Radiculopathy, lumbar region: Secondary | ICD-10-CM

## 2021-09-16 NOTE — Therapy (Signed)
[facility] [address] [place], [place], [address] Phone: [phone]   Fax:  [phone]  Physical Therapy Treatment  Patient Details  Name: Dennis Joseph MRN: [id number] Date of Birth: 1980/10/03 Referring Provider (PT): Dr. Autumn Patty   Encounter Date: 09/16/2021   PT End of Session - 09/16/21 1709     Visit Number 2    Date for PT Re-Evaluation 10/28/21    Authorization Type BCBS    PT Start Time 1630    PT Stop Time 1700    PT Time Calculation (min) 30 min    Activity Tolerance Patient tolerated treatment well             Past Medical History:  Diagnosis Date   Sciatica     Past Surgical History:  Procedure Laterality Date   lumbar discectomy L5 Bilateral 02/07/2020    There were no vitals filed for this visit.   Subjective Assessment - 09/16/21 1644     Subjective Patient states he is doing well.  He rates his pain at 2/10.  He admits he hasn't had a lot of time to do his HEP but understands the importance of it and will do better.    How long can you sit comfortably? sitting usually stiff with rising    Patient Stated Goals wants to get back to cardio and safe lifting; get back to running    Currently in Pain? No/denies    Pain Score 0-No pain    Pain Onset More than a month ago                               [facility] Adult PT Treatment/Exercise - 09/16/21 0001       Exercises   Exercises Lumbar      Lumbar Exercises: Stretches   Active Hamstring Stretch Right;Left;3 reps;30 seconds    Active Hamstring Stretch Limitations standing    Hip Flexor Stretch Right;Left;3 reps;30 seconds    Hip Flexor Stretch Limitations standing with UE support      Lumbar Exercises: Aerobic   Nustep Level 6 x 5 min for warm up with PT discussing progress      Lumbar Exercises: Supine   Dead Bug 20 reps    Other Supine Lumbar Exercises Dead bug with physioball x 20      Lumbar  Exercises: Quadruped   Opposite Arm/Leg Raise Right arm/Left leg;Left arm/Right leg;20 reps                     PT Education - 09/16/21 1707     Education Details Educated on importance of LE stretching in relieving tension in the low back.  Added standing HS and quad/hip flexor stretches.              PT Short Term Goals - 09/02/21 2214       PT SHORT TERM GOAL #1   Title ind with intitial HEP    Time 4    Period Weeks    Status New    Target Date 09/30/21      PT SHORT TERM GOAL #2   Title Patient will have lumbar and hip mobility to demonstrate hip hinge method for picking up small objects and prepare for return to lifting    Time 4    Period Weeks    Status New      PT SHORT  TERM GOAL #3   Title Improved lumbar flexion to 50 degrees, extension to 20 degrees needed for mobility with ADLs at home and work    Time 4    Period Weeks    Status New      PT SHORT TERM GOAL #4   Title HS length improved to 60 degrees bil needed for bending/stooping, gait for longer distances    Time 4    Period Weeks    Status New               PT Long Term Goals - 09/02/21 2218       PT LONG TERM GOAL #1   Title able to bend and pick up something off the floor with 75% more ease    Time 8    Period Weeks    Status New    Target Date 10/28/21      PT LONG TERM GOAL #2   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    Time 8    Period Weeks    Status New      PT LONG TERM GOAL #3   Title Improved right hip abduction strength and trunk strength to grossly 4+/5 needed for future return to jogging    Time 8    Period Weeks    Status New      PT LONG TERM GOAL #4   Title Ind with advanced HEP to maintain core strength and pelvic stability during exercise    Time 8    Period Weeks    Status New      PT LONG TERM GOAL #5   Title pt will be able to perform SLS x15  sec bilat without Trendelenburg or  instability for endurance needed during running activities    Time 8    Period Weeks    Status New      Additional Long Term Goals   Additional Long Term Goals Yes      PT LONG TERM GOAL #6   Title FOTO score improved to 62% indicating improved function with less pain    Time 8    Period Weeks    Status New                   Plan - 09/16/21 1709     Clinical Impression Statement Mr. Annye AsaBoyce arrives with min pain and only some tightness in the low back but he feels it was sligthly improved.  We added LE stretching and more core exercises to his plan.  He fatigued easily but had no increase in pain.  He responded very well to the LE stretching stating he felt relief of the tension in his back after doing these.    Personal Factors and Comorbidities Past/Current Experience;Time since onset of injury/illness/exacerbation    Examination-Activity Limitations Lift;Bend;Locomotion Level;Stand    Examination-Participation Restrictions Occupation;Community Activity    Stability/Clinical Decision Making Stable/Uncomplicated    Clinical Decision Making Low    Rehab Potential Good    PT Frequency 2x / week    PT Duration 8 weeks    PT Treatment/Interventions ADLs/Self Care Home Management;Aquatic Therapy;Cryotherapy;Electrical Stimulation;Ultrasound;Moist Heat;Traction;Iontophoresis 4mg /ml Dexamethasone;Therapeutic activities;Therapeutic exercise;Neuromuscular re-education;Manual techniques;Patient/family education;Dry needling;Taping    PT Next Visit Plan Continue LE flexibility and core strengthening as tolerated.    PT Home Exercise Plan Added standing hamstring and quad/hip flexor stretching to HEP  Patient will benefit from skilled therapeutic intervention in order to improve the following deficits and impairments:  Pain, Decreased range of motion, Decreased strength, Decreased activity tolerance, Increased muscle spasms, Impaired flexibility  Visit  Diagnosis: Radiculopathy, lumbar region  Muscle spasm of back  Muscle weakness (generalized)  Other muscle spasm     Problem List There are no problems to display for this patient.   Victorino Dike B. Yameli Delamater, PT 01/24/235:14 PM   [facility] [address] [place], [place], [address] Phone: [phone]   Fax:  [phone]  Name: Cameryn Chrisley MRN: [id number] Date of Birth: [birth year]

## 2021-09-16 NOTE — Patient Instructions (Signed)
Added LE stretches and dead bug to HEP

## 2021-09-18 ENCOUNTER — Other Ambulatory Visit: Payer: Self-pay

## 2021-09-18 ENCOUNTER — Ambulatory Visit: Payer: BC Managed Care – PPO

## 2021-09-18 DIAGNOSIS — M6281 Muscle weakness (generalized): Secondary | ICD-10-CM

## 2021-09-18 DIAGNOSIS — M62838 Other muscle spasm: Secondary | ICD-10-CM

## 2021-09-18 DIAGNOSIS — M62830 Muscle spasm of back: Secondary | ICD-10-CM

## 2021-09-18 DIAGNOSIS — M5416 Radiculopathy, lumbar region: Secondary | ICD-10-CM

## 2021-09-18 NOTE — Therapy (Signed)
[facility] @ [facility] [address] [place], [place], [address] Phone: [phone]   Fax:  [phone]  Physical Therapy Treatment  Patient Details  Name: Dennis Joseph MRN: [id number] Date of Birth: 19-Jan-1981 Referring Provider (PT): Dr. Autumn Patty   Encounter Date: 09/18/2021   PT End of Session - 09/18/21 1624     Visit Number 3    Date for PT Re-Evaluation 10/28/21    Authorization Type BCBS    PT Start Time 1619    PT Stop Time 1655    PT Time Calculation (min) 36 min    Activity Tolerance Patient tolerated treatment well    Behavior During Therapy [facility] for tasks assessed/performed             Past Medical History:  Diagnosis Date   Sciatica     Past Surgical History:  Procedure Laterality Date   lumbar discectomy L5 Bilateral 02/07/2020    There were no vitals filed for this visit.   Subjective Assessment - 09/18/21 1622     Subjective Pt states that he is doing very well overall. He would like to start back to his morning walking routine. He denies pain today, just reports mild tightness in low back.    Patient Stated Goals wants to get back to cardio and safe lifting; get back to running    Currently in Pain? No/denies    Multiple Pain Sites No                               [facility] Adult PT Treatment/Exercise - 09/18/21 0001       Lumbar Exercises: Stretches   Active Hamstring Stretch Right;Left;3 reps;30 seconds    Active Hamstring Stretch Limitations supine with strap    Hip Flexor Stretch Right;Left;3 reps;30 seconds    Hip Flexor Stretch Limitations Kneeling on pad    Piriformis Stretch Right;Left;60 seconds;1 rep    Other Lumbar Stretch Exercise Supine sciatic nerve tensioners with strap, 2 x 10 Bil      Lumbar Exercises: Aerobic   Nustep Level 6 x 5 min for warm up with PT discussing progress      Lumbar Exercises: Seated   Sit to Stand 20 reps   With 10 lb kettle bell  for second set of 10     Lumbar Exercises: Supine   Dead Bug 20 reps    Other Supine Lumbar Exercises Dead bug with physioball x 20      Lumbar Exercises: Sidelying   Clam 20 reps;Both      Lumbar Exercises: Quadruped   Madcat/Old Horse 20 reps    Opposite Arm/Leg Raise Right arm/Left leg;Left arm/Right leg;20 reps                     PT Education - 09/18/21 1623     Education Details Pt education performed on gradual return to walking program in order to prevent aggravation of symptoms. HEP updated with written handout.    Person(s) Educated Patient    Methods Explanation;Demonstration;Tactile cues;Verbal cues;Handout    Comprehension Verbalized understanding              PT Short Term Goals - 09/02/21 2214       PT SHORT TERM GOAL #1   Title ind with intitial HEP    Time 4    Period Weeks    Status New  Target Date 09/30/21      PT SHORT TERM GOAL #2   Title Patient will have lumbar and hip mobility to demonstrate hip hinge method for picking up small objects and prepare for return to lifting    Time 4    Period Weeks    Status New      PT SHORT TERM GOAL #3   Title Improved lumbar flexion to 50 degrees, extension to 20 degrees needed for mobility with ADLs at home and work    Time 4    Period Weeks    Status New      PT SHORT TERM GOAL #4   Title HS length improved to 60 degrees bil needed for bending/stooping, gait for longer distances    Time 4    Period Weeks    Status New               PT Long Term Goals - 09/02/21 2218       PT LONG TERM GOAL #1   Title able to bend and pick up something off the floor with 75% more ease    Time 8    Period Weeks    Status New    Target Date 10/28/21      PT LONG TERM GOAL #2   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    Time 8    Period Weeks    Status New      PT LONG TERM GOAL #3   Title Improved right hip abduction  strength and trunk strength to grossly 4+/5 needed for future return to jogging    Time 8    Period Weeks    Status New      PT LONG TERM GOAL #4   Title Ind with advanced HEP to maintain core strength and pelvic stability during exercise    Time 8    Period Weeks    Status New      PT LONG TERM GOAL #5   Title pt will be able to perform SLS x15  sec bilat without Trendelenburg or instability for endurance needed during running activities    Time 8    Period Weeks    Status New      Additional Long Term Goals   Additional Long Term Goals Yes      PT LONG TERM GOAL #6   Title FOTO score improved to 62% indicating improved function with less pain    Time 8    Period Weeks    Status New                   Plan - 09/18/21 1638     Clinical Impression Statement Pt overall doing very well with minimal pain/tightness in low back; he would like to return to morning walking program and we discussed gradual return. He continues to respond well to moiblity activities and no increased pain with addition of supine sciatic nerve tensioner this session. Gentle strengthening progressed with clam shells and sit<>stands with good form. He is demosntrating good core control with core exercises, but requires VCs to breathe. He will continue to benefit from skilled PT intervention in order to progress towards goal completion.    PT Treatment/Interventions ADLs/Self Care Home Management;Aquatic Therapy;Cryotherapy;Electrical Stimulation;Ultrasound;Moist Heat;Traction;Iontophoresis 4mg /ml Dexamethasone;Therapeutic activities;Therapeutic exercise;Neuromuscular re-education;Manual techniques;Patient/family education;Dry needling;Taping    PT Next Visit Plan Continue to progress mobility and core/hip strengthening  to tolerance.    PT Home Exercise Plan [id number]             Patient will benefit from skilled therapeutic intervention in order to improve the following deficits and impairments:   Pain, Decreased range of motion, Decreased strength, Decreased activity tolerance, Increased muscle spasms, Impaired flexibility  Visit Diagnosis: Radiculopathy, lumbar region  Muscle spasm of back  Muscle weakness (generalized)  Other muscle spasm     Problem List There are no problems to display for this patient.   Julio AlmKristen Ladarian Bonczek, PT, DPT01/26/235:01 PM   [facility] @ [facility] [address] [place], [place], [address] Phone: [phone]   Fax:  [phone]  Name: Maia PlanBrenton Joseph MRN: [id number] Date of Birth: 01/25/1981

## 2021-09-18 NOTE — Patient Instructions (Signed)
Access Code: [email or web address] URL: [email or web address] Date: 09/18/2021 Prepared by: Julio Alm  Exercises Clamshell - 1 x daily - 7 x weekly - 3 sets - 10 reps Dead Bug with Swiss Ball - 1 x daily - 7 x weekly - 3 sets - 10 reps Sit to Stand with Arms Crossed - 1 x daily - 7 x weekly - 3 sets - 10 reps Bird Dog - 1 x daily - 7 x weekly - 3 sets - 10 reps

## 2021-09-23 ENCOUNTER — Ambulatory Visit: Payer: BC Managed Care – PPO

## 2021-09-23 ENCOUNTER — Other Ambulatory Visit: Payer: Self-pay

## 2021-09-23 DIAGNOSIS — M62830 Muscle spasm of back: Secondary | ICD-10-CM

## 2021-09-23 DIAGNOSIS — M6281 Muscle weakness (generalized): Secondary | ICD-10-CM

## 2021-09-23 DIAGNOSIS — M5416 Radiculopathy, lumbar region: Secondary | ICD-10-CM | POA: Diagnosis not present

## 2021-09-23 DIAGNOSIS — M62838 Other muscle spasm: Secondary | ICD-10-CM

## 2021-09-23 NOTE — Patient Instructions (Signed)
If running short on time for HEP,  stretches are probably going to help most with the pain.

## 2021-09-23 NOTE — Therapy (Signed)
[facility] @ [facility] [address] [place], [place], [address] Phone: [phone]   Fax:  [phone]  Physical Therapy Treatment  Patient Details  Name: Dennis Joseph MRN: [id number] Date of Birth: Sep 20, 1980 Referring Provider (PT): Dr. Autumn Patty   Encounter Date: 09/23/2021   PT End of Session - 09/23/21 1628     Visit Number 4    Date for PT Re-Evaluation 10/28/21    Authorization Type BCBS    PT Start Time 1626    PT Stop Time 1700    PT Time Calculation (min) 34 min    Activity Tolerance Patient tolerated treatment well    Behavior During Therapy [facility] for tasks assessed/performed             Past Medical History:  Diagnosis Date   Sciatica     Past Surgical History:  Procedure Laterality Date   lumbar discectomy L5 Bilateral 02/07/2020    There were no vitals filed for this visit.   Subjective Assessment - 09/23/21 1626     Subjective Patient arrives 10 min late.  He rates his pain at 5/10 in low back.    How long can you sit comfortably? sitting usually stiff with rising    Patient Stated Goals wants to get back to cardio and safe lifting; get back to running    Currently in Pain? Yes    Pain Score 5     Pain Location Back    Pain Orientation Lower    Pain Descriptors / Indicators Aching    Pain Type Surgical pain    Pain Onset More than a month ago    Pain Frequency Constant                               [facility] Adult PT Treatment/Exercise - 09/23/21 0001       Lumbar Exercises: Stretches   Active Hamstring Stretch Right;Left;3 reps;30 seconds    Active Hamstring Stretch Limitations standing    Hip Flexor Stretch Right;Left;3 reps;30 seconds    Hip Flexor Stretch Limitations standing with foot in chair behind with 2 balance pads    Piriformis Stretch Right;Left;60 seconds;1 rep    Other Lumbar Stretch Exercise Supine sciatic nerve tensioners with strap, 2 x 10 Bil       Lumbar Exercises: Aerobic   Nustep Level 6 x 5 min for warm up with PT discussing progress      Lumbar Exercises: Seated   Sit to Stand 20 reps   With 20 lb kb 2 sets of 10     Lumbar Exercises: Supine   Dead Bug 20 reps    Other Supine Lumbar Exercises Dead bug with physioball x 20      Lumbar Exercises: Sidelying   Clam 20 reps;Both      Lumbar Exercises: Quadruped   Madcat/Old Horse 20 reps    Opposite Arm/Leg Raise Right arm/Left leg;Left arm/Right leg;20 reps                       PT Short Term Goals - 09/02/21 2214       PT SHORT TERM GOAL #1   Title ind with intitial HEP    Time 4    Period Weeks    Status New    Target Date 09/30/21      PT SHORT TERM GOAL #2   Title Patient will  have lumbar and hip mobility to demonstrate hip hinge method for picking up small objects and prepare for return to lifting    Time 4    Period Weeks    Status New      PT SHORT TERM GOAL #3   Title Improved lumbar flexion to 50 degrees, extension to 20 degrees needed for mobility with ADLs at home and work    Time 4    Period Weeks    Status New      PT SHORT TERM GOAL #4   Title HS length improved to 60 degrees bil needed for bending/stooping, gait for longer distances    Time 4    Period Weeks    Status New               PT Long Term Goals - 09/02/21 2218       PT LONG TERM GOAL #1   Title able to bend and pick up something off the floor with 75% more ease    Time 8    Period Weeks    Status New    Target Date 10/28/21      PT LONG TERM GOAL #2   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    Time 8    Period Weeks    Status New      PT LONG TERM GOAL #3   Title Improved right hip abduction strength and trunk strength to grossly 4+/5 needed for future return to jogging    Time 8    Period Weeks    Status New      PT LONG TERM GOAL #4   Title Ind with advanced HEP to maintain core  strength and pelvic stability during exercise    Time 8    Period Weeks    Status New      PT LONG TERM GOAL #5   Title pt will be able to perform SLS x15  sec bilat without Trendelenburg or instability for endurance needed during running activities    Time 8    Period Weeks    Status New      Additional Long Term Goals   Additional Long Term Goals Yes      PT LONG TERM GOAL #6   Title FOTO score improved to 62% indicating improved function with less pain    Time 8    Period Weeks    Status New                   Plan - 09/23/21 1640     Clinical Impression Statement Mr. Dennis Joseph is able to tolerate treatment sessions without pain and is responding well to current plan but is very short of time outside of therapy which interferes with his ability to follow through on his HEP.  He would benefit from improved consistency with HEP and his walking program.  He would also benefit from continued skilled PT for core strength and LE flexibility    Personal Factors and Comorbidities Past/Current Experience;Time since onset of injury/illness/exacerbation    Examination-Activity Limitations Lift;Bend;Locomotion Level;Stand    Examination-Participation Restrictions Occupation;Community Activity    Stability/Clinical Decision Making Stable/Uncomplicated    Clinical Decision Making Low    Rehab Potential Good    PT Frequency 2x / week    PT Duration 8 weeks    PT Treatment/Interventions ADLs/Self Care Home Management;Aquatic Therapy;Cryotherapy;Electrical Stimulation;Ultrasound;Moist Heat;Traction;Iontophoresis  4mg /ml Dexamethasone;Therapeutic activities;Therapeutic exercise;Neuromuscular re-education;Manual techniques;Patient/family education;Dry needling;Taping    PT Next Visit Plan Continue to progress mobility and core/hip strengthening to tolerance.    PT Home Exercise Plan [id number]    Consulted and Agree with Plan of Care Patient             Patient will benefit from skilled  therapeutic intervention in order to improve the following deficits and impairments:  Pain, Decreased range of motion, Decreased strength, Decreased activity tolerance, Increased muscle spasms, Impaired flexibility  Visit Diagnosis: Radiculopathy, lumbar region  Muscle spasm of back  Muscle weakness (generalized)  Other muscle spasm     Problem List There are no problems to display for this patient.   Victorino DikeJennifer B. Dennis Joseph, PT 01/31/235:05 PM   [facility] [address] [place], [place], [address] Phone: [phone]   Fax:  [phone]  Name: Dennis Joseph MRN: [id number] Date of Birth: 11/10/1980

## 2021-09-25 ENCOUNTER — Other Ambulatory Visit: Payer: Self-pay

## 2021-09-25 ENCOUNTER — Ambulatory Visit: Payer: BC Managed Care – PPO | Attending: Neurological Surgery

## 2021-09-25 DIAGNOSIS — M62830 Muscle spasm of back: Secondary | ICD-10-CM | POA: Insufficient documentation

## 2021-09-25 DIAGNOSIS — M5416 Radiculopathy, lumbar region: Secondary | ICD-10-CM | POA: Diagnosis not present

## 2021-09-25 DIAGNOSIS — M6281 Muscle weakness (generalized): Secondary | ICD-10-CM | POA: Diagnosis present

## 2021-09-25 DIAGNOSIS — M62838 Other muscle spasm: Secondary | ICD-10-CM | POA: Diagnosis present

## 2021-09-25 NOTE — Therapy (Signed)
[facility] [address] [place], [place], [address] Phone: [phone]   Fax:  [phone]  Physical Therapy Treatment  Patient Details  Name: Dennis Joseph MRN: [id number] Date of Birth: 07/30/1981 Referring Provider (PT): Dr. Autumn Pattyhomas Ostergard   Encounter Date: 09/25/2021   PT End of Session - 09/25/21 1626     Visit Number 5    Date for PT Re-Evaluation 10/28/21    Authorization Type BCBS    PT Start Time 1622    PT Stop Time 1658    PT Time Calculation (min) 36 min    Activity Tolerance Patient tolerated treatment well    Behavior During Therapy [facility] for tasks assessed/performed             Past Medical History:  Diagnosis Date   Sciatica     Past Surgical History:  Procedure Laterality Date   lumbar discectomy L5 Bilateral 02/07/2020    There were no vitals filed for this visit.   Subjective Assessment - 09/25/21 1624     Subjective Patient states he is doing ok.  He rates his pain at 2/10.  "It's just tightness"    How long can you sit comfortably? sitting usually stiff with rising    Patient Stated Goals wants to get back to cardio and safe lifting; get back to running    Currently in Pain? Yes    Pain Score 2     Pain Location Back    Pain Orientation Lower    Pain Descriptors / Indicators Aching    Pain Type Surgical pain    Pain Onset More than a month ago                               [facility]/Exercise - 09/25/21 0001       Lumbar Exercises: Stretches   Active Hamstring Stretch Right;Left;3 reps;30 seconds    Active Hamstring Stretch Limitations standing    Hip Flexor Stretch Right;Left;3 reps;30 seconds    Hip Flexor Stretch Limitations standing with foot in chair behind with 2 balance pads    Piriformis Stretch Right;Left;60 seconds;1 rep    Other Lumbar Stretch Exercise Supine sciatic nerve tensioners with strap, 2 x 10 Bil      Lumbar Exercises:  Aerobic   Nustep Level 6 x 5 min for warm up with PT discussing progress      Lumbar Exercises: Supine   Dead Bug 20 reps    Other Supine Lumbar Exercises Dead bug with physioball x 20    Other Supine Lumbar Exercises Ball pass 2 x 10      Lumbar Exercises: Quadruped   Madcat/Old Horse 20 reps    Opposite Arm/Leg Raise Right arm/Left leg;Left arm/Right leg;20 reps                     PT Education - 09/25/21 1643     Education Details Educated on monitoring for nerve root irritation with nerve flossing              PT Short Term Goals - 09/02/21 2214       PT SHORT TERM GOAL #1   Title ind with intitial HEP    Time 4    Period Weeks    Status New    Target Date 09/30/21      PT SHORT TERM GOAL #2  Title Patient will have lumbar and hip mobility to demonstrate hip hinge method for picking up small objects and prepare for return to lifting    Time 4    Period Weeks    Status New      PT SHORT TERM GOAL #3   Title Improved lumbar flexion to 50 degrees, extension to 20 degrees needed for mobility with ADLs at home and work    Time 4    Period Weeks    Status New      PT SHORT TERM GOAL #4   Title HS length improved to 60 degrees bil needed for bending/stooping, gait for longer distances    Time 4    Period Weeks    Status New               PT Long Term Goals - 09/02/21 2218       PT LONG TERM GOAL #1   Title able to bend and pick up something off the floor with 75% more ease    Time 8    Period Weeks    Status New    Target Date 10/28/21      PT LONG TERM GOAL #2   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    Time 8    Period Weeks    Status New      PT LONG TERM GOAL #3   Title Improved right hip abduction strength and trunk strength to grossly 4+/5 needed for future return to jogging    Time 8    Period Weeks    Status New      PT LONG TERM GOAL #4   Title Ind with  advanced HEP to maintain core strength and pelvic stability during exercise    Time 8    Period Weeks    Status New      PT LONG TERM GOAL #5   Title pt will be able to perform SLS x15  sec bilat without Trendelenburg or instability for endurance needed during running activities    Time 8    Period Weeks    Status New      Additional Long Term Goals   Additional Long Term Goals Yes      PT LONG TERM GOAL #6   Title FOTO score improved to 62% indicating improved function with less pain    Time 8    Period Weeks    Status New                   Plan - 09/25/21 1644     Clinical Impression Statement Patient is progressing appropriately.  He demonstrates fairly good core control on lower level core strength exercises but struggles to maintain pelvic tilt on higher level activities.  He would benefit from continued skilled PT for core strengthening and LE flexibility.    Personal Factors and Comorbidities Past/Current Experience;Time since onset of injury/illness/exacerbation    Examination-Activity Limitations Lift;Bend;Locomotion Level;Stand    Examination-Participation Restrictions Occupation;Community Activity    Stability/Clinical Decision Making Stable/Uncomplicated    Clinical Decision Making Low    Rehab Potential Good    PT Frequency 2x / week    PT Duration 8 weeks    PT Treatment/Interventions ADLs/Self Care Home Management;Aquatic Therapy;Cryotherapy;Electrical Stimulation;Ultrasound;Moist Heat;Traction;Iontophoresis 4mg /ml Dexamethasone;Therapeutic activities;Therapeutic exercise;Neuromuscular re-education;Manual techniques;Patient/family education;Dry needling;Taping    PT Next Visit Plan Continue to progress mobility and core/hip  strengthening to tolerance.    PT Home Exercise Plan [phone]    Consulted and Agree with Plan of Care Patient             Patient will benefit from skilled therapeutic intervention in order to improve the following deficits and  impairments:  Pain, Decreased range of motion, Decreased strength, Decreased activity tolerance, Increased muscle spasms, Impaired flexibility  Visit Diagnosis: Radiculopathy, lumbar region  Muscle spasm of back  Muscle weakness (generalized)     Problem List There are no problems to display for this patient.   Anderson Malta B. Valery Chance, PT 02/02/235:05 PM   [facility] @ [address] [place], [place], [address] Phone: [phone]   Fax:  [phone]  Name: Dennis Joseph MRN: [id number] Date of Birth: 10-25-1980

## 2021-09-30 ENCOUNTER — Ambulatory Visit: Payer: BC Managed Care – PPO

## 2021-09-30 ENCOUNTER — Other Ambulatory Visit: Payer: Self-pay

## 2021-09-30 DIAGNOSIS — M62830 Muscle spasm of back: Secondary | ICD-10-CM

## 2021-09-30 DIAGNOSIS — M62838 Other muscle spasm: Secondary | ICD-10-CM

## 2021-09-30 DIAGNOSIS — M5416 Radiculopathy, lumbar region: Secondary | ICD-10-CM | POA: Diagnosis not present

## 2021-09-30 DIAGNOSIS — M6281 Muscle weakness (generalized): Secondary | ICD-10-CM

## 2021-09-30 NOTE — Therapy (Signed)
[facility] @ [facility] [address] [place], [place], [address] Phone: [phone]   Fax:  [phone]  Physical Therapy Treatment  Patient Details  Name: Dennis Joseph MRN: [id number] Date of Birth: Jan 19, 1981 Referring Provider (PT): Dr. Autumn Patty   Encounter Date: 09/30/2021   PT End of Session - 09/30/21 1621     Visit Number 6    Date for PT Re-Evaluation 10/28/21    Authorization Type BCBS    PT Start Time 1615    PT Stop Time 1655    PT Time Calculation (min) 40 min    Activity Tolerance Patient tolerated treatment well    Behavior During Therapy [facility] for tasks assessed/performed             Past Medical History:  Diagnosis Date   Sciatica     Past Surgical History:  Procedure Laterality Date   lumbar discectomy L5 Bilateral 02/07/2020    There were no vitals filed for this visit.   Subjective Assessment - 09/30/21 1618     Subjective Patient states he has been doing well today.  He rates his pain at 2/10.    How long can you sit comfortably? sitting usually stiff with rising    Patient Stated Goals wants to get back to cardio and safe lifting; get back to running    Currently in Pain? Yes    Pain Score 2     Pain Location Back    Pain Onset More than a month ago                               [facility] Adult PT Treatment/Exercise - 09/30/21 0001       Lumbar Exercises: Stretches   Active Hamstring Stretch Right;Left;3 reps;30 seconds    Active Hamstring Stretch Limitations standing    Hip Flexor Stretch Right;Left;3 reps;30 seconds    Hip Flexor Stretch Limitations standing with foot in chair behind with 2 balance pads    Piriformis Stretch Right;Left;60 seconds;1 rep    Other Lumbar Stretch Exercise Supine sciatic nerve tensioners with strap, 2 x 10 Bil      Lumbar Exercises: Aerobic   Nustep Level 6 x 5 min for warm up with PT discussing progress      Lumbar Exercises: Seated    Sit to Stand 20 reps   With 20 lb kb 2 sets of 10     Lumbar Exercises: Supine   Dead Bug 20 reps    Bridge 10 reps    Bridge Limitations heels on red physio ball    Other Supine Lumbar Exercises Dead bug with physioball x 20    Other Supine Lumbar Exercises Ball pass 2 x 10      Lumbar Exercises: Quadruped   Madcat/Old Horse 20 reps    Opposite Arm/Leg Raise Right arm/Left leg;Left arm/Right leg;20 reps                       PT Short Term Goals - 09/30/21 2148       PT SHORT TERM GOAL #1   Title ind with intitial HEP    Time 4    Period Weeks    Status Achieved    Target Date 09/30/21      PT SHORT TERM GOAL #2   Title Patient will have lumbar and hip mobility to demonstrate hip hinge method for picking  up small objects and prepare for return to lifting    Time 4    Period Weeks    Status On-going    Target Date 09/30/21      PT SHORT TERM GOAL #3   Title Improved lumbar flexion to 50 degrees, extension to 20 degrees needed for mobility with ADLs at home and work    Time 4    Period Weeks    Status On-going    Target Date 09/30/21      PT SHORT TERM GOAL #4   Title HS length improved to 60 degrees bil needed for bending/stooping, gait for longer distances    Time 4    Period Weeks    Status On-going    Target Date 09/30/21               PT Long Term Goals - 09/02/21 2218       PT LONG TERM GOAL #1   Title able to bend and pick up something off the floor with 75% more ease    Time 8    Period Weeks    Status New    Target Date 10/28/21      PT LONG TERM GOAL #2   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    Time 8    Period Weeks    Status New      PT LONG TERM GOAL #3   Title Improved right hip abduction strength and trunk strength to grossly 4+/5 needed for future return to jogging    Time 8    Period Weeks    Status New      PT LONG TERM GOAL #4   Title Ind with  advanced HEP to maintain core strength and pelvic stability during exercise    Time 8    Period Weeks    Status New      PT LONG TERM GOAL #5   Title pt will be able to perform SLS x15  sec bilat without Trendelenburg or instability for endurance needed during running activities    Time 8    Period Weeks    Status New      Additional Long Term Goals   Additional Long Term Goals Yes      PT LONG TERM GOAL #6   Title FOTO score improved to 62% indicating improved function with less pain    Time 8    Period Weeks    Status New                   Plan - 09/30/21 2145     Clinical Impression Statement Dennis Joseph is having less pain and is able to do higher level core strengthening each visit.  He is able to maintain pelvic tilt throughout 10 reps on higher level exercises.  He is appears to be diligent with his HEP as he is able to demonstrate HEP with minimal verbal cues.  He should continue to do well.  He would benefit from continued skilled PT for advanced core strengthening and LE flexibility.    Personal Factors and Comorbidities Past/Current Experience;Time since onset of injury/illness/exacerbation    Examination-Activity Limitations Lift;Bend;Locomotion Level;Stand    Examination-Participation Restrictions Occupation;Community Activity    Stability/Clinical Decision Making Stable/Uncomplicated    Clinical Decision Making Low    Rehab Potential Good    PT Frequency 2x / week    PT  Duration 8 weeks    PT Treatment/Interventions ADLs/Self Care Home Management;Aquatic Therapy;Cryotherapy;Electrical Stimulation;Ultrasound;Moist Heat;Traction;Iontophoresis 4mg /ml Dexamethasone;Therapeutic activities;Therapeutic exercise;Neuromuscular re-education;Manual techniques;Patient/family education;Dry needling;Taping    PT Next Visit Plan Continue to progress mobility and core/hip strengthening to tolerance.    PT Home Exercise Plan [phone]    Consulted and Agree with Plan of Care  Patient             Patient will benefit from skilled therapeutic intervention in order to improve the following deficits and impairments:  Pain, Decreased range of motion, Decreased strength, Decreased activity tolerance, Increased muscle spasms, Impaired flexibility  Visit Diagnosis: Radiculopathy, lumbar region  Muscle spasm of back  Muscle weakness (generalized)  Other muscle spasm     Problem List There are no problems to display for this patient.   [id number] B. Lorean Ekstrand, PT 02/07/239:50 PM   [facility] [address] [place], [place], [place] Phone: [phone]   Fax:  [phone]  Name: Dennis Joseph MRN: Maia Plan Date of Birth: 10/27/1980

## 2021-10-07 ENCOUNTER — Ambulatory Visit: Payer: BC Managed Care – PPO

## 2021-10-07 ENCOUNTER — Other Ambulatory Visit: Payer: Self-pay

## 2021-10-07 DIAGNOSIS — M62838 Other muscle spasm: Secondary | ICD-10-CM

## 2021-10-07 DIAGNOSIS — M6281 Muscle weakness (generalized): Secondary | ICD-10-CM

## 2021-10-07 DIAGNOSIS — M62830 Muscle spasm of back: Secondary | ICD-10-CM

## 2021-10-07 DIAGNOSIS — M5416 Radiculopathy, lumbar region: Secondary | ICD-10-CM

## 2021-10-07 NOTE — Therapy (Signed)
[facility] [address] [place], [place], [address] Phone: [phone]   Fax:  [phone]  Physical Therapy Treatment  Patient Details  Name: Beryl Balz MRN: [id number] Date of Birth: [birth year] Referring Provider (PT): Dr. Autumn Patty   Encounter Date: 10/07/2021   PT End of Session - 10/07/21 1628     Visit Number 7    Date for PT Re-Evaluation 10/28/21    Authorization Type BCBS    PT Start Time 1618    PT Stop Time 1700    PT Time Calculation (min) 42 min    Activity Tolerance Patient tolerated treatment well    Behavior During Therapy [facility] for tasks assessed/performed             Past Medical History:  Diagnosis Date   Sciatica     Past Surgical History:  Procedure Laterality Date   lumbar discectomy L5 Bilateral 02/07/2020    There were no vitals filed for this visit.   Subjective Assessment - 10/07/21 1626     Subjective Patient states, "a little tight today".  Pain 4/10.  I haven't been doing my stretching as diligently.    How long can you sit comfortably? sitting usually stiff with rising    Patient Stated Goals wants to get back to cardio and safe lifting; get back to running    Currently in Pain? Yes    Pain Score 4     Pain Location Back    Pain Orientation Left    Pain Descriptors / Indicators Discomfort    Pain Type Surgical pain    Pain Onset More than a month ago                               [facility] Adult PT Treatment/Exercise - 10/07/21 0001       Lumbar Exercises: Stretches   Active Hamstring Stretch Right;Left;3 reps;30 seconds    Active Hamstring Stretch Limitations standing    Hip Flexor Stretch Right;Left;3 reps;30 seconds    Hip Flexor Stretch Limitations standing with foot in chair behind with 2 balance pads    Piriformis Stretch Right;Left;60 seconds;1 rep    Other Lumbar Stretch Exercise Supine sciatic nerve tensioners with strap, 2 x 10 Bil       Lumbar Exercises: Aerobic   Nustep Level 6 x 5 min for warm up with PT discussing progress      Lumbar Exercises: Supine   Dead Bug 20 reps    Bridge 20 reps    Bridge Limitations heels on red physio ball    Other Supine Lumbar Exercises Dead bug with physioball x 20    Other Supine Lumbar Exercises Ball pass 2 x 10      Lumbar Exercises: Quadruped   Madcat/Old Horse 20 reps    Opposite Arm/Leg Raise Right arm/Left leg;Left arm/Right leg;20 reps                       PT Short Term Goals - 09/30/21 2148       PT SHORT TERM GOAL #1   Title ind with intitial HEP    Time 4    Period Weeks    Status Achieved    Target Date 09/30/21      PT SHORT TERM GOAL #2   Title Patient will have lumbar and hip mobility to demonstrate hip hinge method for picking  up small objects and prepare for return to lifting    Time 4    Period Weeks    Status On-going    Target Date 09/30/21      PT SHORT TERM GOAL #3   Title Improved lumbar flexion to 50 degrees, extension to 20 degrees needed for mobility with ADLs at home and work    Time 4    Period Weeks    Status On-going    Target Date 09/30/21      PT SHORT TERM GOAL #4   Title HS length improved to 60 degrees bil needed for bending/stooping, gait for longer distances    Time 4    Period Weeks    Status On-going    Target Date 09/30/21               PT Long Term Goals - 09/02/21 2218       PT LONG TERM GOAL #1   Title able to bend and pick up something off the floor with 75% more ease    Time 8    Period Weeks    Status New    Target Date 10/28/21      PT LONG TERM GOAL #2   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    Time 8    Period Weeks    Status New      PT LONG TERM GOAL #3   Title Improved right hip abduction strength and trunk strength to grossly 4+/5 needed for future return to jogging    Time 8    Period Weeks    Status New       PT LONG TERM GOAL #4   Title Ind with advanced HEP to maintain core strength and pelvic stability during exercise    Time 8    Period Weeks    Status New      PT LONG TERM GOAL #5   Title pt will be able to perform SLS x15  sec bilat without Trendelenburg or instability for endurance needed during running activities    Time 8    Period Weeks    Status New      Additional Long Term Goals   Additional Long Term Goals Yes      PT LONG TERM GOAL #6   Title FOTO score improved to 62% indicating improved function with less pain    Time 8    Period Weeks    Status New                   Plan - 10/07/21 1702     Clinical Impression Statement Patient is progressing appropriately.  He is struggling with compliance due to his busy schedule.  He would benefit from more consistent stretching and compliance with HEP.  He should continue to improve with skilled PT for LE flexibility and core strength.    Personal Factors and Comorbidities Past/Current Experience;Time since onset of injury/illness/exacerbation    Examination-Activity Limitations Lift;Bend;Locomotion Level;Stand    Examination-Participation Restrictions Occupation;Community Activity    Stability/Clinical Decision Making Stable/Uncomplicated    Clinical Decision Making Low    Rehab Potential Good    PT Frequency 2x / week    PT Duration 8 weeks    PT Treatment/Interventions ADLs/Self Care Home Management;Aquatic Therapy;Cryotherapy;Electrical Stimulation;Ultrasound;Moist Heat;Traction;Iontophoresis 4mg /ml Dexamethasone;Therapeutic activities;Therapeutic exercise;Neuromuscular re-education;Manual techniques;Patient/family education;Dry needling;Taping    PT Next Visit Plan Continue to  progress mobility and core/hip strengthening to tolerance.    PT Home Exercise Plan [id number]    Consulted and Agree with Plan of Care Patient             Patient will benefit from skilled therapeutic intervention in order to  improve the following deficits and impairments:  Pain, Decreased range of motion, Decreased strength, Decreased activity tolerance, Increased muscle spasms, Impaired flexibility  Visit Diagnosis: Radiculopathy, lumbar region  Muscle spasm of back  Muscle weakness (generalized)  Other muscle spasm     Problem List There are no problems to display for this patient.   Victorino Dike B. Zael Shuman, PT 02/14/235:05 PM   [facility] [address] [place], [place], [address] Phone: [phone]   Fax:  [phone]  Name: Jerime Gonya MRN: [id number] Date of Birth: [birth year]

## 2021-10-09 ENCOUNTER — Ambulatory Visit: Payer: BC Managed Care – PPO

## 2021-10-28 ENCOUNTER — Ambulatory Visit: Payer: BC Managed Care – PPO | Attending: Neurological Surgery

## 2021-10-28 ENCOUNTER — Other Ambulatory Visit: Payer: Self-pay

## 2021-10-28 DIAGNOSIS — M62830 Muscle spasm of back: Secondary | ICD-10-CM | POA: Diagnosis present

## 2021-10-28 DIAGNOSIS — M6281 Muscle weakness (generalized): Secondary | ICD-10-CM | POA: Diagnosis present

## 2021-10-28 DIAGNOSIS — M5416 Radiculopathy, lumbar region: Secondary | ICD-10-CM | POA: Diagnosis not present

## 2021-10-28 DIAGNOSIS — M62838 Other muscle spasm: Secondary | ICD-10-CM | POA: Diagnosis present

## 2021-10-28 NOTE — Patient Instructions (Signed)
Revised and reviewed HEP:  patient uses medbridge app on his phone.  He was able to pull updated HEP up prior to leaving and was able to demonstrate all HEP independently with min verbal cues.   ?

## 2021-10-28 NOTE — Therapy (Signed)
[facility] [place] ?[place], [place], [address] ?Phone: [phone]   Fax:  [phone] ? ?Physical Therapy Treatment ? ?Patient Details  ?Name: Dennis Joseph ?MRN: [id number] ?Date of Birth: [birth year] ?Referring Provider (PT): Dr. Emelda Brothers ? ? ?Encounter Date: 10/28/2021 ? ? PT End of Session - 10/28/21 2138   ? ? Visit Number 8   ? Date for PT Re-Evaluation 10/28/21   ? Authorization Type BCBS   ? PT Start Time 1620   ? PT Stop Time 1700   ? PT Time Calculation (min) 40 min   ? Activity Tolerance Patient tolerated treatment well   ? Behavior During Therapy [facility] for tasks assessed/performed   ? ?  ?  ? ?  ? ? ?Past Medical History:  ?Diagnosis Date  ? Sciatica   ? ? ?Past Surgical History:  ?Procedure Laterality Date  ? lumbar discectomy L5 Bilateral 02/07/2020  ? ? ?There were no vitals filed for this visit. ? ? Subjective Assessment - 10/28/21 2140   ? ? Subjective Patient states he is doing well.  He reports "just a little tight at times".   ? How long can you sit comfortably? sitting usually stiff with rising   ? Patient Stated Goals wants to get back to cardio and safe lifting; get back to running   ? Currently in Pain? No/denies   ? Pain Score 0-No pain   ? Pain Onset More than a month ago   ? Pain Frequency Intermittent   ? ?  ?  ? ?  ? ? ? ? ? [facility] PT Assessment - 10/28/21 0001   ? ?  ? Assessment  ? Medical Diagnosis post surgical L5 discectomy   ? Referring Provider (PT) Dr. Emelda Brothers   ? Onset Date/Surgical Date 07/14/21   ? Next MD Visit 09/11/21   ? Prior Therapy after 1st surgery   ?  ? Observation/Other Assessments  ? Focus on Therapeutic Outcomes (FOTO)  57%   ?  ? Posture/Postural Control  ? Posture/Postural Control No significant limitations   ?  ? AROM  ? Overall AROM Comments able to lie flat in prone   ? Lumbar Flexion 70   ? Lumbar Extension 15   ? Lumbar - Right Side Bend 40   ? Lumbar - Left Side Bend 35   ?  ? Strength   ? Right Hip ABduction 5/5   ? Left Hip ABduction 5/5   ? Lumbar Flexion 4+/5   ? Lumbar Extension 4+/5   ?  ? Flexibility  ? Hamstrings 65 left, 50 right   ?  ? FABER test  ? findings Negative   ?  ? Slump test  ? Findings Negative   ?  ? Prone Knee Bend Test  ? Findings Negative   ?  ? Straight Leg Raise  ? Findings Negative   ? ?  ?  ? ?  ? ? ? ? ? ? ? ? ? ? ? ? ? ? ? ? ? ? ? ? ? ? ? ? ? PT Education - 10/28/21 2149   ? ? Education Details Revised and reviewed HEP.   ? Person(s) Educated Patient   ? Methods Explanation;Demonstration;Verbal cues;Handout   ? Comprehension Verbalized understanding;Returned demonstration   ? ?  ?  ? ?  ? ? ? PT Short Term Goals - 10/28/21 2201   ? ?  ? PT SHORT TERM GOAL #1  ?  Title ind with intitial HEP   ? Time 4   ? Period Weeks   ? Status Achieved   ? Target Date 09/30/21   ?  ? PT SHORT TERM GOAL #2  ? Title Patient will have lumbar and hip mobility to demonstrate hip hinge method for picking up small objects and prepare for return to lifting   ? Time 4   ? Period Weeks   ? Status Achieved   ? Target Date 09/30/21   ?  ? PT SHORT TERM GOAL #3  ? Title Improved lumbar flexion to 50 degrees, extension to 20 degrees needed for mobility with ADLs at home and work   ? Time 4   ? Period Weeks   ? Status Achieved   ? Target Date 09/30/21   ?  ? PT SHORT TERM GOAL #4  ? Title HS length improved to 60 degrees bil needed for bending/stooping, gait for longer distances   ? Time 4   ? Period Weeks   ? Status Partially Met   ? Target Date 09/30/21   ? ?  ?  ? ?  ? ? ? ? PT Long Term Goals - 10/28/21 2203   ? ?  ? PT LONG TERM GOAL #1  ? Title able to bend and pick up something off the floor with 75% more ease   ? Baseline uses legs and is able to do this   ? Time 8   ? Period Weeks   ? Status Achieved   ? Target Date 10/28/21   ?  ? PT LONG TERM GOAL #2  ? 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 Baseline reduced by 75%  (frequency)   ? Time 8   ? Period Weeks   ? Status Achieved   ? Target Date 06/06/20   ?  ? PT LONG TERM GOAL #6  ? Title FOTO score improved to 62% indicating improved function with less pain   ? Time 8   ? Period Weeks   ? Status Partially Met   ? ?  ?  ? ?  ? ? ? ? ? ? ? ? Plan - 10/28/21 2149   ? ? Clinical Impression Statement Dennis Joseph has met all goals. He is progressing appropriately.  He is independent and compliant with his HEP.  He should continue to do well.  We will DC at this time with all goals met.   ? Personal Factors and Comorbidities Past/Current Experience;Time since onset of injury/illness/exacerbation   ? Examination-Activity Limitations Lift;Bend;Locomotion Level;Stand   ? Examination-Participation Restrictions Occupation;Community Activity   ? Stability/Clinical Decision Making Stable/Uncomplicated   ? Clinical Decision Making Low   ? Rehab Potential Good   ? PT Frequency 2x / week   ? PT Duration 8 weeks   ? PT Treatment/Interventions ADLs/Self Care Home Management;Aquatic Therapy;Cryotherapy;Electrical Stimulation;Ultrasound;Moist Heat;Traction;Iontophoresis 74m/ml Dexamethasone;Therapeutic activities;Therapeutic exercise;Neuromuscular re-education;Manual techniques;Patient/family education;Dry needling;Taping   ? PT Next Visit Plan We will DC at this time.   ? PT Home Exercise Plan [phone]  ? Consulted and Agree with Plan of Care Patient   ? ?  ?  ? ?  ? ? ?Patient will benefit from skilled therapeutic intervention in order to improve the following deficits and impairments:  Pain, Decreased range of motion, Decreased strength, Decreased activity tolerance, Increased muscle spasms, Impaired flexibility ? ?Visit Diagnosis: ?Radiculopathy, lumbar region ? ?Muscle  spasm of back ? ?Muscle weakness (generalized) ? ?Other muscle spasm ? ?PHYSICAL THERAPY DISCHARGE SUMMARY ? ?Visits from Start of Care: 9 ? ?Current functional level related to goals / functional outcomes: ?See above  ?   ?Remaining deficits: ?See above ?  ?Education / Equipment: ?See above  ? ?Patient agrees to discharge. Patient goals were met. Patient is being discharged due to meeting the stated rehab goals.  ? ? ?Problem List ?There are no problems to display for this patient. ? ? ?Anderson Malta B. Adna Nofziger, PT ?10/28/2308:09 PM  ? ?[facility] [place] ?[place], [place], [address] ?Phone: [phone]   Fax:  [phone] ? ?Name: Dennis Joseph ?MRN: [id number] ?Date of Birth: 1981/02/03 ? ? ? ?

## 2022-07-07 ENCOUNTER — Ambulatory Visit: Payer: BC Managed Care – PPO | Admitting: Podiatry

## 2022-07-07 DIAGNOSIS — B353 Tinea pedis: Secondary | ICD-10-CM

## 2022-07-07 DIAGNOSIS — B351 Tinea unguium: Secondary | ICD-10-CM

## 2022-07-07 MED ORDER — TERBINAFINE HCL 250 MG PO TABS: 250.0000 mg | ORAL_TABLET | Freq: Every day | ORAL | 0 refills | Status: AC

## 2022-07-07 MED ORDER — KETOCONAZOLE 2 % EX CREA: 1.0000 | TOPICAL_CREAM | Freq: Two times a day (BID) | CUTANEOUS | 2 refills | Status: AC

## 2022-07-09 ENCOUNTER — Encounter: Payer: Self-pay | Admitting: Podiatry

## 2022-07-09 NOTE — Progress Notes (Signed)
  Subjective:  Patient ID: Dennis Joseph, male    DOB: 04/11/1981,  MRN: [id number]  Chief Complaint  Patient presents with   Nail Problem    NP bilateral toe discoloration, pain on bottom of feet   Tinea Pedis    41 y.o. male presents with the above complaint. History confirmed with patient.  He has had worsening discoloration of the toenails on both feet especially the left great toenail as well as itching burning scaling skin  Objective:  Physical Exam: warm, good capillary refill, no trophic changes or ulcerative lesions, normal DP and PT pulses, normal sensory exam, onychomycosis, and tinea pedis.        Assessment:   1. Onychomycosis   2. Tinea pedis of both feet      Plan:  Patient was evaluated and treated and all questions answered.  Discussed the etiology and treatment options for tinea pedis and onychomycosis.  Discussed topical and oral treatment.  Recommended dual treatment with p.o. Lamisil 90-day course and 2% ketoconazole cream.  This was sent to the patient's pharmacy.  Also discussed appropriate foot hygiene, use of antifungal spray such as Tinactin in shoes, as well as cleaning foot surfaces such as showers and bathroom floors with bleach.   Return in about 3 months (around 10/07/2022) for at risk diabetic foot care.

## 2022-10-13 ENCOUNTER — Ambulatory Visit (INDEPENDENT_AMBULATORY_CARE_PROVIDER_SITE_OTHER): Payer: BC Managed Care – PPO | Admitting: Podiatry

## 2022-10-13 DIAGNOSIS — B351 Tinea unguium: Secondary | ICD-10-CM | POA: Diagnosis not present

## 2022-10-13 MED ORDER — FLUCONAZOLE 150 MG PO TABS: 150.0000 mg | ORAL_TABLET | ORAL | 0 refills | Status: AC

## 2022-10-13 NOTE — Patient Instructions (Signed)

## 2022-10-17 ENCOUNTER — Encounter: Payer: Self-pay | Admitting: Podiatry

## 2022-10-17 NOTE — Progress Notes (Signed)
  Subjective:  Patient ID: Trung Ardito, male    DOB: 06-Sep-1980,  MRN: [id number]  Chief Complaint  Patient presents with   Nail Problem    Thick painful toenails, 3 month follow up    42 y.o. male presents with the above complaint. History confirmed with patient.  He notes quite a bit of improvement in the skin, has not seen much change in the toenails, no problems taking Lamisil no side effects noted  Objective:  Physical Exam: warm, good capillary refill, no trophic changes or ulcerative lesions, normal DP and PT pulses, normal sensory exam, onychomycosis of bilateral hallux nails unchanged, tinea pedis resolved      Assessment:   1. Onychomycosis      Plan:  Patient was evaluated and treated and all questions answered.  We did discuss the etiology and treatment options for his further onychomycosis.  Has taken 90 days of Lamisil with minimal change.  Recommended avulsion of the nail plate and continuing antifungal therapy with pulsed dosing.  Diflucan Rx sent to pharmacy.  Following consent and digital block with lidocaine and Marcaine, the bilateral hallux nail was avulsed without application of phenol.  Return in 6 months for follow-up after nail regrowth and use of Diflucan pulsed dosing  Return in about 6 months (around 04/13/2023) for follow up after nail fungus treatment.

## 2022-11-05 ENCOUNTER — Other Ambulatory Visit: Payer: Self-pay

## 2022-11-05 ENCOUNTER — Encounter (HOSPITAL_BASED_OUTPATIENT_CLINIC_OR_DEPARTMENT_OTHER): Payer: Self-pay

## 2022-11-05 ENCOUNTER — Other Ambulatory Visit (HOSPITAL_BASED_OUTPATIENT_CLINIC_OR_DEPARTMENT_OTHER): Payer: Self-pay

## 2022-11-05 ENCOUNTER — Emergency Department (HOSPITAL_BASED_OUTPATIENT_CLINIC_OR_DEPARTMENT_OTHER): Payer: BC Managed Care – PPO | Admitting: Radiology

## 2022-11-05 ENCOUNTER — Emergency Department (HOSPITAL_BASED_OUTPATIENT_CLINIC_OR_DEPARTMENT_OTHER)
Admission: EM | Admit: 2022-11-05 | Discharge: 2022-11-05 | Disposition: A | Discharge status: Home / Self Care | Payer: BC Managed Care – PPO | Attending: Emergency Medicine | Admitting: Emergency Medicine

## 2022-11-05 DIAGNOSIS — R0789 Other chest pain: Secondary | ICD-10-CM | POA: Insufficient documentation

## 2022-11-05 DIAGNOSIS — R079 Chest pain, unspecified: Secondary | ICD-10-CM

## 2022-11-05 LAB — CBC
HCT: 39.3 % (ref 39.0–52.0)
Hemoglobin: 12.3 g/dL — ABNORMAL LOW (ref 13.0–17.0)
MCH: 19.2 pg — ABNORMAL LOW (ref 26.0–34.0)
MCHC: 31.3 g/dL (ref 30.0–36.0)
MCV: 61.5 fL — ABNORMAL LOW (ref 80.0–100.0)
Platelets: 221 10*3/uL (ref 150–400)
RBC: 6.39 MIL/uL — ABNORMAL HIGH (ref 4.22–5.81)
RDW: 20.6 % — ABNORMAL HIGH (ref 11.5–15.5)
WBC: 5.9 10*3/uL (ref 4.0–10.5)
nRBC: 0 % (ref 0.0–0.2)

## 2022-11-05 LAB — TROPONIN I (HIGH SENSITIVITY)
Troponin I (High Sensitivity): 2 ng/L (ref <18)
Troponin I (High Sensitivity): 3 ng/L (ref <18)

## 2022-11-05 LAB — BASIC METABOLIC PANEL
Anion gap: 8 (ref 5–15)
BUN: 13 mg/dL (ref 6–20)
CO2: 25 mmol/L (ref 22–32)
Calcium: 9 mg/dL (ref 8.9–10.3)
Chloride: 108 mmol/L (ref 98–111)
Creatinine, Ser: 1.15 mg/dL (ref 0.61–1.24)
GFR, Estimated: >60 mL/min (ref >60)
Glucose, Bld: 117 mg/dL — ABNORMAL HIGH (ref 70–99)
Potassium: 3.6 mmol/L (ref 3.5–5.1)
Sodium: 141 mmol/L (ref 135–145)

## 2022-11-05 NOTE — ED Provider Notes (Signed)
[facility] Provider Note   CSN: [id number] Arrival date & time: 11/05/22  1523     History  Chief Complaint  Patient presents with   Chest Pain    Dennis Joseph is a 42 y.o. male.  42 year old male with history of elevated BMI presents emergency department with chest discomfort.  Reports that his symptoms started after sleeping on his left side last night which occasionally causes chest discomfort.  Describes it as a dull sensation along his sternum that is worsened with pushing on his chest.  No other injuries to his chest or pectoral muscle.  Says it is not exertional no diaphoresis or vomiting associated with it.  Friend had a heart attack recently and was concerned so came into the emergency department for evaluation.  No significant tobacco use.  No immediate family history of MI.  No personal history of MI.  Denies any shortness of breath, dizziness, or leg swelling.  Denies any illicit drug use.       Home Medications Prior to Admission medications   Medication Sig Start Date End Date Taking? Authorizing Provider  fluconazole (DIFLUCAN) 150 MG tablet Take 1 tablet (150 mg total) by mouth once a week for 26 doses. 10/13/22 04/07/23  Criselda Peaches, DPM  fluticasone (FLONASE) 50 MCG/ACT nasal spray OTC FLONASE AS NEEDED. 05/27/18   [provider]  ketoconazole (NIZORAL) 2 % cream Apply 1 Application topically 2 (two) times daily. 07/07/22   Criselda Peaches, DPM  oxyCODONE-acetaminophen (PERCOCET) 5-325 MG tablet Take by mouth. 07/14/21   [provider]      Allergies    Patient has no known allergies.    Review of Systems   Review of Systems  Physical Exam Updated Vital Signs BP 104/72   Pulse 61   Temp 98.3 F (36.8 C) (Oral)   Resp 13   Ht '5\' 10"'$  (1.778 m)   Wt 117.9 kg   SpO2 100%   BMI 37.31 kg/m  Physical Exam Vitals and nursing note reviewed.  Constitutional:      General: He is not in  acute distress.    Appearance: He is well-developed.  HENT:     Head: Normocephalic and atraumatic.     Right Ear: External ear normal.     Left Ear: External ear normal.     Nose: Nose normal.  Eyes:     Extraocular Movements: Extraocular movements intact.     Conjunctiva/sclera: Conjunctivae normal.     Pupils: Pupils are equal, round, and reactive to light.  Cardiovascular:     Rate and Rhythm: Normal rate and regular rhythm.     Heart sounds: Normal heart sounds.     Comments: Chest pain reproducible Pulmonary:     Effort: Pulmonary effort is normal. No respiratory distress.     Breath sounds: Normal breath sounds.  Musculoskeletal:     Cervical back: Normal range of motion and neck supple.     Right lower leg: No edema.     Left lower leg: No edema.  Skin:    General: Skin is warm and dry.  Neurological:     Mental Status: He is alert. Mental status is at baseline.  Psychiatric:        Mood and Affect: Mood normal.        Behavior: Behavior normal.     ED Results / Procedures / Treatments   Labs (all labs ordered are listed, but only abnormal results  are displayed) Labs Reviewed  BASIC METABOLIC PANEL - Abnormal; Notable for the following components:      Result Value   Glucose, Bld 117 (*)    All other components within normal limits  CBC - Abnormal; Notable for the following components:   RBC 6.39 (*)    Hemoglobin 12.3 (*)    MCV 61.5 (*)    MCH 19.2 (*)    RDW 20.6 (*)    All other components within normal limits  TROPONIN I (HIGH SENSITIVITY)  TROPONIN I (HIGH SENSITIVITY)    EKG EKG Interpretation  Date/Time:  Thursday November 05 2022 15:35:14 EDT Ventricular Rate:  64 PR Interval:  144 QRS Duration: 100 QT Interval:  400 QTC Calculation: 412 R Axis:   74 Text Interpretation: Normal sinus rhythm Nonspecific T wave abnormality Abnormal ECG No previous ECGs available Confirmed by Margaretmary Eddy [phone]) on 11/05/2022 6:12:29 PM  Radiology DG  Chest 2 View  Result Date: 11/05/2022 CLINICAL DATA:  Chest pain. EXAM: CHEST - 2 VIEW COMPARISON:  Chest/abdominal radiographs 10/13/2010 FINDINGS: The cardiomediastinal silhouette is within normal limits. The lungs are well inflated and clear. There is no evidence of pleural effusion or pneumothorax. No acute osseous abnormality is identified. IMPRESSION: No active cardiopulmonary disease. Electronically Signed   By: Logan Bores M.D.   On: 11/05/2022 16:07    Procedures Procedures   Medications Ordered in ED Medications - No data to display  ED Course/ Medical Decision Making/ A&P           HEART Score: 2                Medical Decision Making Amount and/or Complexity of Data Reviewed Labs: ordered. Radiology: ordered.   Dennis Joseph is a 42 y.o. male with comorbidities that complicate the patient evaluation including elevated BMI presents to the emergency department with chest discomfort is reproducible on exam  Initial Ddx:  MSK pain, MI, PE, pneumonia  MDM:  The patient likely has MSK pain based on his description of his symptoms.  Will obtain EKG and troponins to assess for MI but feel this is less likely given his lack of other anginal equivalents.  No significant shortness of breath or other respiratory symptoms to suggest a PE or pneumonia.  Plan:  Labs Troponin EKG Chest x-ray  ED Summary/Re-evaluation:  Patient underwent the above workup which was unremarkable.  Patient instructed to take Tylenol and ibuprofen for his chest discomfort and follow-up with his primary doctor in several days.    This patient presents to the ED for concern of complaints listed in HPI, this involves an extensive number of treatment options, and is a complaint that carries with it a high risk of complications and morbidity. Disposition including potential need for admission considered.   Dispo: DC Home. Return precautions discussed including, but not limited to, those listed in the AVS.  Allowed pt time to ask questions which were answered fully prior to dc.  Records reviewed Outpatient Clinic Notes The following labs were independently interpreted: Serial Troponins and show no acute abnormality I independently reviewed the following imaging with scope of interpretation limited to determining acute life threatening conditions related to emergency care: Chest x-ray and agree with the radiologist interpretation with the following exceptions: none I personally reviewed and interpreted cardiac monitoring: normal sinus rhythm  I personally reviewed and interpreted the pt's EKG: see above for interpretation  I have reviewed the patients home medications and made adjustments as needed  Final Clinical Impression(s) / ED Diagnoses Final diagnoses:  Chest pain, unspecified type    Rx / DC Orders ED Discharge Orders     None         Fransico Meadow, MD 11/05/22 [phone]

## 2022-11-05 NOTE — Discharge Instructions (Signed)
You were seen for your chest pain in the emergency department.   At home, please take Tylenol and ibuprofen for your chest discomfort.    Follow-up with your primary doctor in 2-3 days regarding your visit.   Return immediately to the emergency department if you experience any of the following: Worsening pain, difficulty breathing, unexplained vomiting or sweating, or any other concerning symptoms.    Thank you for visiting our Emergency Department. It was a pleasure taking care of you today.

## 2022-11-05 NOTE — ED Triage Notes (Signed)
Patient here POV from Home.  Endorses CP that began Last PM. Inconsistent and intermittent. More Dull. Burping provided Very mid Relief. No SOB. Central Chest and mostly non-radiating  NAD noted during triage. A&Ox4. GCS 15. Ambulatory.

## 2023-04-13 ENCOUNTER — Ambulatory Visit: Payer: BC Managed Care – PPO | Admitting: Podiatry

## 2023-04-15 ENCOUNTER — Ambulatory Visit: Payer: BC Managed Care – PPO | Admitting: Podiatry

## 2023-04-15 ENCOUNTER — Encounter: Payer: Self-pay | Admitting: Podiatry

## 2023-04-15 DIAGNOSIS — B351 Tinea unguium: Secondary | ICD-10-CM | POA: Diagnosis not present

## 2023-04-15 NOTE — Progress Notes (Signed)
  Subjective:  Patient ID: Dennis Joseph, male    DOB: 1981/07/25,  MRN: [id number]  Chief Complaint  Patient presents with   Nail Problem    RM4: patient is here for nail fungus F/U    42 y.o. male presents with the above complaint. History confirmed with patient.  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 Onychomycosis      Plan:  Patient was evaluated and treated and all questions answered.  Doing very well and fully cleared his onychomycosis, do not see further indication for oral antifungal therapy, he should allow the nails to grow out.  Return to see me as needed if this returns or worsens.  Return if symptoms worsen or fail to improve.
